# Patient Record
Sex: Female | Born: 1975 | Race: White | Hispanic: No | Marital: Married | Smoking: Never smoker
Health system: Southern US, Community
[De-identification: ages and names within clinical notes are randomized; demographics above are authoritative.]

## PROBLEM LIST (undated history)

## (undated) DIAGNOSIS — F419 Anxiety disorder, unspecified: Secondary | ICD-10-CM

## (undated) HISTORY — PX: WISDOM TOOTH EXTRACTION: SHX21

## (undated) HISTORY — DX: Anxiety disorder, unspecified: F41.9

---

## 2005-10-01 ENCOUNTER — Ambulatory Visit: Payer: Self-pay

## 2011-09-30 ENCOUNTER — Ambulatory Visit: Payer: Self-pay

## 2012-08-25 ENCOUNTER — Ambulatory Visit: Payer: Self-pay | Admitting: Unknown Physician Specialty

## 2013-12-23 LAB — HM PAP SMEAR: HM Pap smear: NORMAL

## 2015-01-10 ENCOUNTER — Other Ambulatory Visit: Payer: Self-pay | Admitting: Obstetrics and Gynecology

## 2015-01-10 DIAGNOSIS — Z1231 Encounter for screening mammogram for malignant neoplasm of breast: Secondary | ICD-10-CM

## 2015-01-16 ENCOUNTER — Ambulatory Visit
Admission: RE | Admit: 2015-01-16 | Discharge: 2015-01-16 | Disposition: A | Discharge status: Home / Self Care | Payer: 59 | Source: Ambulatory Visit | Attending: Obstetrics and Gynecology | Admitting: Obstetrics and Gynecology

## 2015-01-16 DIAGNOSIS — Z1231 Encounter for screening mammogram for malignant neoplasm of breast: Secondary | ICD-10-CM | POA: Diagnosis present

## 2015-01-17 ENCOUNTER — Other Ambulatory Visit: Payer: Self-pay | Admitting: Obstetrics and Gynecology

## 2015-01-17 DIAGNOSIS — R928 Other abnormal and inconclusive findings on diagnostic imaging of breast: Secondary | ICD-10-CM

## 2015-01-18 ENCOUNTER — Other Ambulatory Visit: Payer: Self-pay | Admitting: Obstetrics and Gynecology

## 2015-01-18 ENCOUNTER — Ambulatory Visit
Admission: RE | Admit: 2015-01-18 | Discharge: 2015-01-18 | Disposition: A | Discharge status: Home / Self Care | Payer: 59 | Source: Ambulatory Visit | Attending: Obstetrics and Gynecology | Admitting: Obstetrics and Gynecology

## 2015-01-18 DIAGNOSIS — R928 Other abnormal and inconclusive findings on diagnostic imaging of breast: Secondary | ICD-10-CM | POA: Diagnosis present

## 2015-12-13 ENCOUNTER — Other Ambulatory Visit: Payer: Self-pay | Admitting: Internal Medicine

## 2015-12-13 DIAGNOSIS — Z1231 Encounter for screening mammogram for malignant neoplasm of breast: Secondary | ICD-10-CM

## 2016-01-28 ENCOUNTER — Ambulatory Visit: Payer: 59 | Attending: Internal Medicine

## 2016-03-18 ENCOUNTER — Ambulatory Visit
Admission: RE | Admit: 2016-03-18 | Discharge: 2016-03-18 | Disposition: A | Discharge status: Home / Self Care | Payer: 59 | Source: Ambulatory Visit | Attending: Internal Medicine | Admitting: Internal Medicine

## 2016-03-18 DIAGNOSIS — Z1231 Encounter for screening mammogram for malignant neoplasm of breast: Secondary | ICD-10-CM | POA: Diagnosis not present

## 2016-07-29 ENCOUNTER — Ambulatory Visit: Payer: Self-pay | Admitting: Obstetrics and Gynecology

## 2016-08-05 DIAGNOSIS — J069 Acute upper respiratory infection, unspecified: Secondary | ICD-10-CM | POA: Diagnosis not present

## 2016-11-13 ENCOUNTER — Other Ambulatory Visit: Payer: Self-pay | Admitting: Internal Medicine

## 2016-11-13 DIAGNOSIS — R221 Localized swelling, mass and lump, neck: Secondary | ICD-10-CM | POA: Diagnosis not present

## 2016-11-20 ENCOUNTER — Ambulatory Visit
Admission: RE | Admit: 2016-11-20 | Discharge: 2016-11-20 | Disposition: A | Discharge status: Home / Self Care | Payer: 59 | Source: Ambulatory Visit | Attending: Internal Medicine | Admitting: Internal Medicine

## 2016-11-20 ENCOUNTER — Encounter: Payer: Self-pay | Admitting: Radiology

## 2016-11-20 DIAGNOSIS — R221 Localized swelling, mass and lump, neck: Secondary | ICD-10-CM | POA: Diagnosis not present

## 2016-11-20 MED ORDER — IOPAMIDOL (ISOVUE-300) INJECTION 61%
75.0000 mL | Freq: Once | INTRAVENOUS | Status: AC | PRN
  Administered 2016-11-20: 75 mL via INTRAVENOUS

## 2017-03-19 DIAGNOSIS — Z23 Encounter for immunization: Secondary | ICD-10-CM | POA: Diagnosis not present

## 2017-03-19 DIAGNOSIS — Z0000 Encounter for general adult medical examination without abnormal findings: Secondary | ICD-10-CM | POA: Diagnosis not present

## 2017-03-20 DIAGNOSIS — Z0000 Encounter for general adult medical examination without abnormal findings: Secondary | ICD-10-CM | POA: Diagnosis not present

## 2017-04-01 ENCOUNTER — Other Ambulatory Visit: Payer: Self-pay | Admitting: Obstetrics and Gynecology

## 2017-04-01 DIAGNOSIS — Z1231 Encounter for screening mammogram for malignant neoplasm of breast: Secondary | ICD-10-CM

## 2017-04-27 ENCOUNTER — Ambulatory Visit: Payer: Self-pay | Admitting: Obstetrics and Gynecology

## 2017-04-30 ENCOUNTER — Ambulatory Visit
Admission: RE | Admit: 2017-04-30 | Discharge: 2017-04-30 | Disposition: A | Discharge status: Home / Self Care | Payer: 59 | Source: Ambulatory Visit | Attending: Obstetrics and Gynecology | Admitting: Obstetrics and Gynecology

## 2017-04-30 DIAGNOSIS — Z1231 Encounter for screening mammogram for malignant neoplasm of breast: Secondary | ICD-10-CM | POA: Diagnosis not present

## 2017-05-20 ENCOUNTER — Ambulatory Visit: Payer: Self-pay | Admitting: Obstetrics and Gynecology

## 2017-05-20 ENCOUNTER — Encounter: Payer: Self-pay | Admitting: Obstetrics and Gynecology

## 2018-02-16 ENCOUNTER — Ambulatory Visit (INDEPENDENT_AMBULATORY_CARE_PROVIDER_SITE_OTHER): Payer: Managed Care, Other (non HMO) | Admitting: Obstetrics and Gynecology

## 2018-02-16 ENCOUNTER — Encounter: Payer: Self-pay | Admitting: Obstetrics and Gynecology

## 2018-02-16 VITALS — BP 118/74 | Ht 62.0 in | Wt 144.0 lb

## 2018-02-16 DIAGNOSIS — Z01419 Encounter for gynecological examination (general) (routine) without abnormal findings: Secondary | ICD-10-CM

## 2018-02-16 DIAGNOSIS — Z1331 Encounter for screening for depression: Secondary | ICD-10-CM

## 2018-02-16 DIAGNOSIS — Z124 Encounter for screening for malignant neoplasm of cervix: Secondary | ICD-10-CM

## 2018-02-16 DIAGNOSIS — Z1339 Encounter for screening examination for other mental health and behavioral disorders: Secondary | ICD-10-CM

## 2018-02-16 LAB — HM PAP SMEAR: HM Pap smear: NORMAL

## 2018-02-16 NOTE — Progress Notes (Signed)
Gynecology Annual Exam  PCP: Rusty Aus, MD  Chief Complaint  Patient presents with  . Annual Exam    History of Present Illness:  Ms. April Edwards is a 42 y.o. G1P1001 who LMP was Patient's last menstrual period was 01/17/2018., presents today for her annual examination.  Her menses are regular every 28-30 days, lasting 5 day(s).  Dysmenorrhea none. She does not have intermenstrual bleeding.  She does have vasomotor symptoms very infrequently. She is unsure whether this relates to her recently being on a Keto diet, which she stopped about 1 month ago.  She does have a little vaginal dryness. No discomfort with intercourse.   She is single partner, contraception - coitus interruptus. She does have vaginal dryness.  Last Pap: 2015  Results were: no abnormalities /neg HPV DNA.  Hx of STDs: none  Last mammogram: 04/2017  Results were: normal--routine follow-up in 12 months There is no FH of breast cancer. There is no FH of ovarian cancer. The patient does do self-breast exams.  Colonoscopy: n/a DEXA: has not been screened for osteoporosis  Tobacco use: The patient denies current or previous tobacco use. Alcohol use: social drinker Exercise: very active  The patient wears seatbelts: yes.     Past Medical History:  Diagnosis Date  . Anxiety     Past Surgical History:  Procedure Laterality Date  . WISDOM TOOTH EXTRACTION      Prior to Admission medications   Medication Sig Start Date End Date Taking? Authorizing Provider  mirtazapine (REMERON) 15 MG tablet TAKE 1 TABLET BY MOUTH AT BEDTIME 09/16/17  Yes [provider]  Multiple Vitamin (MULTI-VITAMINS) TABS Take by mouth.    [provider]    Allergies  Allergen Reactions  . Benadryl [Diphenhydramine] Other (See Comments)    Pt states she becomes very hyperactive.    Obstetric History: G1P1001  Family History  Problem Relation Age of Onset  . Diabetes Mellitus II Paternal Grandmother   .  Brain cancer Paternal Grandfather   . Lung cancer Mother   . Kidney failure Father   . Breast cancer Neg Hx     Social History   Socioeconomic History  . Marital status: Married    Spouse name: Not on file  . Number of children: Not on file  . Years of education: Not on file  . Highest education level: Not on file  Occupational History  . Not on file  Social Needs  . Financial resource strain: Not on file  . Food insecurity:    Worry: Not on file    Inability: Not on file  . Transportation needs:    Medical: Not on file    Non-medical: Not on file  Tobacco Use  . Smoking status: Never Smoker  . Smokeless tobacco: Never Used  Substance and Sexual Activity  . Alcohol use: Not Currently  . Drug use: Never  . Sexual activity: Yes    Birth control/protection: None  Lifestyle  . Physical activity:    Days per week: Not on file    Minutes per session: Not on file  . Stress: Not on file  Relationships  . Social connections:    Talks on phone: Not on file    Gets together: Not on file    Attends religious service: Not on file    Active member of club or organization: Not on file    Attends meetings of clubs or organizations: Not on file    Relationship status:  Not on file  . Intimate partner violence:    Fear of current or ex partner: Not on file    Emotionally abused: Not on file    Physically abused: Not on file    Forced sexual activity: Not on file  Other Topics Concern  . Not on file  Social History Narrative  . Not on file   Review of Systems  Constitutional: Negative.   HENT: Negative.   Eyes: Negative.   Respiratory: Negative.   Cardiovascular: Negative.   Gastrointestinal: Negative.   Genitourinary: Negative.   Musculoskeletal: Negative.   Skin: Negative.   Neurological: Negative.   Psychiatric/Behavioral: Negative.      Physical Exam BP 118/74   Ht 5' 2"  (1.575 m)   Wt 144 lb (65.3 kg)   LMP 01/17/2018   BMI 26.34 kg/m   Physical Exam   Constitutional: She is oriented to person, place, and time. She appears well-developed and well-nourished. No distress.  Genitourinary: Uterus normal. Pelvic exam was performed with patient supine. There is no rash, tenderness, lesion or injury on the right labia. There is no rash, tenderness, lesion or injury on the left labia. No erythema, tenderness or bleeding in the vagina. No signs of injury around the vagina. No vaginal discharge found. Right adnexum does not display mass, does not display tenderness and does not display fullness. Left adnexum does not display mass, does not display tenderness and does not display fullness. Cervix does not exhibit motion tenderness, lesion, discharge or polyp.   Uterus is mobile and anteverted. Uterus is not enlarged, tender or exhibiting a mass.  HENT:  Head: Normocephalic and atraumatic.  Eyes: EOM are normal. No scleral icterus.  Neck: Normal range of motion. Neck supple. No thyromegaly present.  Cardiovascular: Normal rate and regular rhythm. Exam reveals no gallop and no friction rub.  No murmur heard. Pulmonary/Chest: Effort normal and breath sounds normal. No respiratory distress. She has no wheezes. She has no rales. Right breast exhibits no inverted nipple, no mass, no nipple discharge, no skin change and no tenderness. Left breast exhibits no inverted nipple, no mass, no nipple discharge, no skin change and no tenderness.  Abdominal: Soft. Bowel sounds are normal. She exhibits no distension and no mass. There is no tenderness. There is no rebound and no guarding.  Musculoskeletal: Normal range of motion. She exhibits no edema or tenderness.  Lymphadenopathy:    She has no cervical adenopathy.       Right: No inguinal adenopathy present.       Left: No inguinal adenopathy present.  Neurological: She is alert and oriented to person, place, and time. No cranial nerve deficit.  Skin: Skin is warm and dry. No rash noted. No erythema.  Psychiatric:  She has a normal mood and affect. Her behavior is normal. Judgment normal.   Female chaperone present for pelvic and breast  portions of the physical exam  Results: AUDIT Questionnaire (screen for alcoholism): 2 PHQ-9: 2  Assessment: 42 y.o. G18P1001 female here for routine gynecologic examination.  Plan: Problem List Items Addressed This Visit    None    Visit Diagnoses    Women's annual routine gynecological examination    -  Primary   Relevant Orders   IGP, Aptima HPV, rfx 16/18,45   Screening for depression       Screening for alcoholism       Pap smear for cervical cancer screening       Relevant Orders   IGP,  Aptima HPV, rfx 16/18,45     Screening: -- Blood pressure screen normal -- Colonoscopy - not due -- Mammogram - due. Patient to call [facility] to arrange. She understands that it is her responsibility to arrange this. -- Weight screening: normal -- Depression screening negative (PHQ-9) -- Nutrition: normal -- cholesterol screening: per PCP -- osteoporosis screening: not due -- tobacco screening: not using -- alcohol screening: AUDIT questionnaire indicates low-risk usage. -- family history of breast cancer screening: done. not at high risk. -- no evidence of domestic violence or intimate partner violence. -- STD screening: gonorrhea/chlamydia NAAT collected -- pap smear collected per ASCCP guidelines -- flu vaccine will get through work -- HPV vaccination series: not eligilbe  Prentice Docker, MD 02/16/2018 9:20 AM

## 2018-02-19 LAB — IGP, APTIMA HPV, RFX 16/18,45
HPV Aptima: NEGATIVE
PAP Smear Comment: 0

## 2018-02-20 ENCOUNTER — Encounter: Payer: Self-pay | Admitting: Obstetrics and Gynecology

## 2018-03-30 ENCOUNTER — Other Ambulatory Visit: Payer: Self-pay | Admitting: Internal Medicine

## 2018-03-30 DIAGNOSIS — Z1231 Encounter for screening mammogram for malignant neoplasm of breast: Secondary | ICD-10-CM

## 2018-05-03 ENCOUNTER — Ambulatory Visit
Admission: RE | Admit: 2018-05-03 | Discharge: 2018-05-03 | Disposition: A | Discharge status: Home / Self Care | Payer: Managed Care, Other (non HMO) | Source: Ambulatory Visit | Attending: Internal Medicine | Admitting: Internal Medicine

## 2018-05-03 DIAGNOSIS — Z1231 Encounter for screening mammogram for malignant neoplasm of breast: Secondary | ICD-10-CM | POA: Insufficient documentation

## 2018-05-05 ENCOUNTER — Other Ambulatory Visit: Payer: Self-pay | Admitting: Internal Medicine

## 2018-05-05 DIAGNOSIS — R928 Other abnormal and inconclusive findings on diagnostic imaging of breast: Secondary | ICD-10-CM

## 2018-05-18 ENCOUNTER — Ambulatory Visit
Admission: RE | Admit: 2018-05-18 | Discharge: 2018-05-18 | Disposition: A | Discharge status: Home / Self Care | Payer: Managed Care, Other (non HMO) | Source: Ambulatory Visit | Attending: Internal Medicine | Admitting: Internal Medicine

## 2018-05-18 DIAGNOSIS — R928 Other abnormal and inconclusive findings on diagnostic imaging of breast: Secondary | ICD-10-CM

## 2018-06-04 ENCOUNTER — Other Ambulatory Visit: Payer: Self-pay | Admitting: Internal Medicine

## 2018-06-04 DIAGNOSIS — R1011 Right upper quadrant pain: Secondary | ICD-10-CM

## 2018-06-04 DIAGNOSIS — K2100 Gastro-esophageal reflux disease with esophagitis, without bleeding: Secondary | ICD-10-CM

## 2018-06-08 ENCOUNTER — Encounter: Payer: Self-pay | Admitting: Emergency Medicine

## 2018-06-08 ENCOUNTER — Emergency Department
Admission: EM | Admit: 2018-06-08 | Discharge: 2018-06-08 | Disposition: A | Discharge status: Home / Self Care | Payer: Managed Care, Other (non HMO) | Attending: Student in an Organized Health Care Education/Training Program | Admitting: Student in an Organized Health Care Education/Training Program

## 2018-06-08 ENCOUNTER — Emergency Department: Payer: Managed Care, Other (non HMO)

## 2018-06-08 ENCOUNTER — Other Ambulatory Visit: Payer: Self-pay

## 2018-06-08 DIAGNOSIS — R51 Headache: Secondary | ICD-10-CM | POA: Insufficient documentation

## 2018-06-08 DIAGNOSIS — R0789 Other chest pain: Secondary | ICD-10-CM | POA: Diagnosis not present

## 2018-06-08 DIAGNOSIS — Z79899 Other long term (current) drug therapy: Secondary | ICD-10-CM | POA: Insufficient documentation

## 2018-06-08 DIAGNOSIS — R42 Dizziness and giddiness: Secondary | ICD-10-CM | POA: Diagnosis not present

## 2018-06-08 LAB — TROPONIN I
Troponin I: <0.03 ng/mL (ref <0.03)
Troponin I: <0.03 ng/mL (ref <0.03)

## 2018-06-08 LAB — BASIC METABOLIC PANEL
ANION GAP: 10 (ref 5–15)
BUN: 11 mg/dL (ref 6–20)
CALCIUM: 9 mg/dL (ref 8.9–10.3)
CHLORIDE: 107 mmol/L (ref 98–111)
CO2: 21 mmol/L — AB (ref 22–32)
Creatinine, Ser: 0.78 mg/dL (ref 0.44–1.00)
GFR calc non Af Amer: >60 mL/min (ref >60)
Glucose, Bld: 121 mg/dL — ABNORMAL HIGH (ref 70–99)
Potassium: 2.9 mmol/L — ABNORMAL LOW (ref 3.5–5.1)
SODIUM: 138 mmol/L (ref 135–145)

## 2018-06-08 LAB — CBC
HEMATOCRIT: 38.3 % (ref 36.0–46.0)
Hemoglobin: 13.1 g/dL (ref 12.0–15.0)
MCH: 30.1 pg (ref 26.0–34.0)
MCHC: 34.2 g/dL (ref 30.0–36.0)
MCV: 88 fL (ref 80.0–100.0)
NRBC: 0 % (ref 0.0–0.2)
PLATELETS: 267 10*3/uL (ref 150–400)
RBC: 4.35 MIL/uL (ref 3.87–5.11)
RDW: 12 % (ref 11.5–15.5)
WBC: 5.4 10*3/uL (ref 4.0–10.5)

## 2018-06-08 LAB — POCT PREGNANCY, URINE: Preg Test, Ur: NEGATIVE

## 2018-06-08 LAB — FIBRIN DERIVATIVES D-DIMER (ARMC ONLY): FIBRIN DERIVATIVES D-DIMER (ARMC): 321.81 ng{FEU}/mL (ref 0.00–499.00)

## 2018-06-08 NOTE — ED Provider Notes (Signed)
[facility] Emergency Department Provider Note    First MD Initiated Contact with Patient 06/08/18 1236     (approximate)  I have reviewed the triage vital signs and the nursing notes.   HISTORY  Chief Complaint Chest Pain; Headache; and Dizziness    HPI April Edwards is a 43 y.o. female with history of anxiety presents the ER with several days of lightheadedness dizziness forehead pressure that is intermittent as well as chest tightness that started this morning.  Denies any pleuritic pain.  No lower extremity swelling.  No recent surgeries not any blood thinners.  Denies any history of smoking.  No history of heart disease.  Denies any pain at this time.  States she is stressed out as she does lost her father 2 weeks ago.  Denies any blurry vision.  No numbness or tingling.    Past Medical History:  Diagnosis Date  . Anxiety    Family History  Problem Relation Age of Onset  . Diabetes Mellitus II Paternal Grandmother   . Brain cancer Paternal Grandfather   . Lung cancer Mother   . Kidney failure Father   . Breast cancer Neg Hx    Past Surgical History:  Procedure Laterality Date  . WISDOM TOOTH EXTRACTION     There are no active problems to display for this patient.     Prior to Admission medications   Medication Sig Start Date End Date Taking? Authorizing Provider  mirtazapine (REMERON) 15 MG tablet TAKE 1 TABLET BY MOUTH AT BEDTIME 09/16/17   [provider]  Multiple Vitamin (MULTI-VITAMINS) TABS Take by mouth.    [provider]    Allergies Benadryl [diphenhydramine]    Social History Social History   Tobacco Use  . Smoking status: Never Smoker  . Smokeless tobacco: Never Used  Substance Use Topics  . Alcohol use: Not Currently  . Drug use: Never    Review of Systems Patient denies headaches, rhinorrhea, blurry vision, numbness, shortness of breath, chest pain, edema, cough, abdominal pain, nausea,  vomiting, diarrhea, dysuria, fevers, rashes or hallucinations unless otherwise stated above in HPI. ____________________________________________   PHYSICAL EXAM:  VITAL SIGNS: Vitals:   06/08/18 1445 06/08/18 1447  BP:  118/86  Pulse: 71 67  Resp: 13 11  Temp:    SpO2: 100% 100%    Constitutional: Alert and oriented.  Eyes: Conjunctivae are normal.  Head: Atraumatic. Nose: No congestion/rhinnorhea. Mouth/Throat: Mucous membranes are moist.   Neck: No stridor. Painless ROM.  Cardiovascular: Normal rate, regular rhythm. Grossly normal heart sounds.  Good peripheral circulation. Respiratory: Normal respiratory effort.  No retractions. Lungs CTAB. Gastrointestinal: Soft and nontender. No distention. No abdominal bruits. No CVA tenderness. Genitourinary:  Musculoskeletal: No lower extremity tenderness nor edema.  No joint effusions. Neurologic:  CN- intact.  No facial droop, Normal FNF.  Normal heel to shin.  Sensation intact bilaterally. Normal speech and language. No gross focal neurologic deficits are appreciated. No gait instability. Skin:  Skin is warm, dry and intact. No rash noted. Psychiatric: Mood and affect are normal. Speech and behavior are normal.  ____________________________________________   LABS (all labs ordered are listed, but only abnormal results are displayed)  Results for orders placed or performed during the hospital encounter of 06/08/18 (from the past 24 hour(s))  Basic metabolic panel     Status: Abnormal   Collection Time: 06/08/18 10:14 AM  Result Value Ref Range   Sodium 138 135 - 145 mmol/L  Potassium 2.9 (L) 3.5 - 5.1 mmol/L   Chloride 107 98 - 111 mmol/L   CO2 21 (L) 22 - 32 mmol/L   Glucose, Bld 121 (H) 70 - 99 mg/dL   BUN 11 6 - 20 mg/dL   Creatinine, Ser 0.78 0.44 - 1.00 mg/dL   Calcium 9.0 8.9 - 10.3 mg/dL   GFR calc non Af Amer >60 >60 mL/min   GFR calc Af Amer >60 >60 mL/min   Anion gap 10 5 - 15  CBC     Status: None   Collection  Time: 06/08/18 10:14 AM  Result Value Ref Range   WBC 5.4 4.0 - 10.5 K/uL   RBC 4.35 3.87 - 5.11 MIL/uL   Hemoglobin 13.1 12.0 - 15.0 g/dL   HCT 38.3 36.0 - 46.0 %   MCV 88.0 80.0 - 100.0 fL   MCH 30.1 26.0 - 34.0 pg   MCHC 34.2 30.0 - 36.0 g/dL   RDW 12.0 11.5 - 15.5 %   Platelets 267 150 - 400 K/uL   nRBC 0.0 0.0 - 0.2 %  Troponin I - ONCE - STAT     Status: None   Collection Time: 06/08/18 10:14 AM  Result Value Ref Range   Troponin I <0.03 <0.03 ng/mL  Pregnancy, urine POC     Status: None   Collection Time: 06/08/18 12:44 PM  Result Value Ref Range   Preg Test, Ur NEGATIVE NEGATIVE  Troponin I - ONCE - STAT     Status: None   Collection Time: 06/08/18  1:27 PM  Result Value Ref Range   Troponin I <0.03 <0.03 ng/mL  Fibrin derivatives D-Dimer ([facility] only)     Status: None   Collection Time: 06/08/18  1:27 PM  Result Value Ref Range   Fibrin derivatives D-dimer ([facility]) 321.81 0.00 - 499.00 ng/mL (FEU)   ____________________________________________  EKG My review and personal interpretation at Time: 10:11   Indication: chest tightness  Rate: 110  Rhythm: sinus Axis: normal Other: normal intervals, no stemi ____________________________________________  RADIOLOGY  I personally reviewed all radiographic images ordered to evaluate for the above acute complaints and reviewed radiology reports and findings.  These findings were personally discussed with the patient.  Please see medical record for radiology report.  ____________________________________________   PROCEDURES  Procedure(s) performed:  Procedures    Critical Care performed: no ____________________________________________   INITIAL IMPRESSION / ASSESSMENT AND PLAN / ED COURSE  Pertinent labs & imaging results that were available during my care of the patient were reviewed by me and considered in my medical decision making (see chart for details).   DDX: Dysrhythmia, stress, ACS, dissection, PE,  sinusitis, mass  April Edwards is a 43 y.o. who presents to the ED with symptoms as described above.  Patient well-appearing.  Neuro exam is intact and nonfocal.  Doubt CNS pathology.  EKG shows no evidence of acute ischemia.  Initial troponin is negative.  Patient low risk heart score.  She is low risk by Wells criteria therefore d-dimer will be sent to further evaluate.  Will place on cardiac monitor and repeat troponin to further re-stratify.  Do have a high suspicion for stress-induced symptomatology given the recent loss of her family member however medical evaluation will be indicated.  The patient will be placed on continuous pulse oximetry and telemetry for monitoring.  Laboratory evaluation will be sent to evaluate for the above complaints.     Clinical Course as of Jun 08 1529  Tue Jun 08, 2018  1412 Blood work is reassuring.  Patient remains hemodynamically stable.  No signs of infectious process.  Dimer is negative.  Repeat troponin negative.  At this point patient stable and appropriate for outpatient follow-up.   [PR]    Clinical Course User Index [PR] Merlyn Lot, MD     As part of my medical decision making, I reviewed the following data within the [facility] notes reviewed and incorporated, Labs reviewed, notes from prior ED visits.   ____________________________________________   FINAL CLINICAL IMPRESSION(S) / ED DIAGNOSES  Final diagnoses:  Atypical chest pain      NEW MEDICATIONS STARTED DURING THIS VISIT:  Discharge Medication List as of 06/08/2018  2:13 PM       Note:  This document was prepared using Dragon voice recognition software and may include unintentional dictation errors.    Merlyn Lot, MD 06/08/18 1531

## 2018-06-08 NOTE — ED Notes (Signed)
Gave a urine cup with bag to pt.

## 2018-06-09 ENCOUNTER — Ambulatory Visit
Admission: RE | Admit: 2018-06-09 | Discharge: 2018-06-09 | Disposition: A | Discharge status: Home / Self Care | Payer: Managed Care, Other (non HMO) | Source: Ambulatory Visit | Attending: Internal Medicine | Admitting: Internal Medicine

## 2018-06-09 DIAGNOSIS — R1011 Right upper quadrant pain: Secondary | ICD-10-CM | POA: Diagnosis not present

## 2018-06-09 DIAGNOSIS — K2100 Gastro-esophageal reflux disease with esophagitis, without bleeding: Secondary | ICD-10-CM

## 2019-03-25 ENCOUNTER — Other Ambulatory Visit: Payer: Self-pay | Admitting: Internal Medicine

## 2019-03-25 DIAGNOSIS — G450 Vertebro-basilar artery syndrome: Secondary | ICD-10-CM

## 2019-04-05 ENCOUNTER — Ambulatory Visit: Payer: Managed Care, Other (non HMO)

## 2019-04-25 DIAGNOSIS — D239 Other benign neoplasm of skin, unspecified: Secondary | ICD-10-CM

## 2019-04-25 HISTORY — DX: Other benign neoplasm of skin, unspecified: D23.9

## 2019-07-29 ENCOUNTER — Encounter: Payer: Self-pay | Admitting: Obstetrics and Gynecology

## 2019-07-29 ENCOUNTER — Other Ambulatory Visit: Payer: Self-pay

## 2019-07-29 ENCOUNTER — Ambulatory Visit (INDEPENDENT_AMBULATORY_CARE_PROVIDER_SITE_OTHER): Payer: 59 | Admitting: Obstetrics and Gynecology

## 2019-07-29 VITALS — BP 122/74 | Ht 63.0 in | Wt 154.0 lb

## 2019-07-29 DIAGNOSIS — Z01419 Encounter for gynecological examination (general) (routine) without abnormal findings: Secondary | ICD-10-CM

## 2019-07-29 DIAGNOSIS — Z1331 Encounter for screening for depression: Secondary | ICD-10-CM

## 2019-07-29 DIAGNOSIS — Z1339 Encounter for screening examination for other mental health and behavioral disorders: Secondary | ICD-10-CM

## 2019-07-29 NOTE — Progress Notes (Signed)
Gynecology Annual Exam  PCP: Rusty Aus, MD  Chief Complaint  Patient presents with  . Annual Exam   History of Present Illness:  Ms. April Edwards is a 44 y.o. G1P1001 who LMP was Patient's last menstrual period was 07/01/2019., presents today for her annual examination.  Her menses are regular, monthly, heavy the first two days, then lightens, lasts about 6 days total. No pain with her periods.  No intermenstrual bleeding.  She is sexually active and times intercourse and uses condoms if close to ovulation.  She does have some vaginal dryness. She denies dyspareunia.   Last Pap: 02/16/2018  Results were: no abnormalities /neg HPV DNA negative Hx of STDs: none  Last mammogram: 05/18/2018  Results were: Birads 1 There is no FH of breast cancer. There is no FH of ovarian cancer. The patient does not do self-breast exams.  Colonoscopy: never had DEXA: has not been screened for osteoporosis  Tobacco use: The patient denies current or previous tobacco use. Alcohol use: social drinker Exercise: she exercises most days.  The patient wears seatbelts: yes.     Past Medical History:  Diagnosis Date  . Anxiety     Past Surgical History:  Procedure Laterality Date  . WISDOM TOOTH EXTRACTION      Prior to Admission medications   Medication Sig Start Date End Date Taking? Authorizing Provider  mirtazapine (REMERON) 15 MG tablet TAKE 1 TABLET BY MOUTH AT BEDTIME 09/16/17  Yes [provider]  Multiple Vitamin (MULTI-VITAMINS) TABS Take by mouth.   Yes [provider]  Xanax, as needed  Allergies  Allergen Reactions  . Benadryl [Diphenhydramine] Other (See Comments)    Pt states she becomes very hyperactive.     Obstetric History: G1P1001  Family History  Problem Relation Age of Onset  . Diabetes Mellitus II Paternal Grandmother   . Brain cancer Paternal Grandfather   . Lung cancer Mother   . Kidney failure Father   . Breast cancer Neg Hx      Social History   Socioeconomic History  . Marital status: Married    Spouse name: Not on file  . Number of children: Not on file  . Years of education: Not on file  . Highest education level: Not on file  Occupational History  . Not on file  Tobacco Use  . Smoking status: Never Smoker  . Smokeless tobacco: Never Used  Substance and Sexual Activity  . Alcohol use: Not Currently  . Drug use: Never  . Sexual activity: Yes    Birth control/protection: None  Other Topics Concern  . Not on file  Social History Narrative  . Not on file   Social Determinants of Health   Financial Resource Strain:   . Difficulty of Paying Living Expenses:   Food Insecurity:   . Worried About Charity fundraiser in the Last Year:   . Arboriculturist in the Last Year:   Transportation Needs:   . Film/video editor (Medical):   Marland Kitchen Lack of Transportation (Non-Medical):   Physical Activity:   . Days of Exercise per Week:   . Minutes of Exercise per Session:   Stress:   . Feeling of Stress :   Social Connections:   . Frequency of Communication with Friends and Family:   . Frequency of Social Gatherings with Friends and Family:   . Attends Religious Services:   . Active Member of Clubs or Organizations:   . Attends Club  or Organization Meetings:   Marland Kitchen Marital Status:   Intimate Partner Violence:   . Fear of Current or Ex-Partner:   . Emotionally Abused:   Marland Kitchen Physically Abused:   . Sexually Abused:     Review of Systems  Constitutional: Negative.   HENT: Negative.   Eyes: Negative.   Respiratory: Negative.   Cardiovascular: Negative.   Gastrointestinal: Negative.   Genitourinary: Negative.   Musculoskeletal: Negative.   Skin: Negative.   Neurological: Negative.   Psychiatric/Behavioral: Negative.       Physical Exam BP 122/74   Ht 5' 3"  (1.6 m)   Wt 154 lb (69.9 kg)   LMP 07/01/2019   BMI 27.28 kg/m   Physical Exam Constitutional:      General: She is not in acute distress.     Appearance: Normal appearance. She is well-developed.  Genitourinary:     Pelvic exam was performed with patient supine.     Vulva, urethra, bladder and uterus normal.     No inguinal adenopathy present in the right or left side.    No signs of injury in the vagina.     No vaginal discharge, erythema, tenderness or bleeding.     No cervical motion tenderness, discharge, lesion or polyp.     Uterus is mobile.     Uterus is not enlarged or tender.     No uterine mass detected.    Uterus is anteverted.     No right or left adnexal mass present.     Right adnexa not tender or full.     Left adnexa not tender or full.  HENT:     Head: Normocephalic and atraumatic.  Eyes:     General: No scleral icterus.    Conjunctiva/sclera: Conjunctivae normal.  Neck:     Thyroid: No thyromegaly.  Cardiovascular:     Rate and Rhythm: Normal rate and regular rhythm.     Heart sounds: No murmur. No friction rub. No gallop.   Pulmonary:     Effort: Pulmonary effort is normal. No respiratory distress.     Breath sounds: Normal breath sounds. No wheezing or rales.  Chest:     Breasts:        Right: No inverted nipple, mass, nipple discharge, skin change or tenderness.        Left: No inverted nipple, mass, nipple discharge, skin change or tenderness.  Abdominal:     General: Bowel sounds are normal. There is no distension.     Palpations: Abdomen is soft. There is no mass.     Tenderness: There is no abdominal tenderness. There is no guarding or rebound.  Musculoskeletal:        General: No swelling or tenderness. Normal range of motion.     Cervical back: Normal range of motion and neck supple.  Lymphadenopathy:     Cervical: No cervical adenopathy.     Lower Body: No right inguinal adenopathy. No left inguinal adenopathy.  Neurological:     General: No focal deficit present.     Mental Status: She is alert and oriented to person, place, and time.     Cranial Nerves: No cranial nerve  deficit.  Skin:    General: Skin is warm and dry.     Findings: No erythema or rash.  Psychiatric:        Mood and Affect: Mood normal.        Behavior: Behavior normal.        Judgment: Judgment  normal.     Female chaperone present for pelvic and breast  portions of the physical exam  Results: AUDIT Questionnaire (screen for alcoholism): 1 PHQ-9: 2  Assessment: 44 y.o. No obstetric history on file. here for routine gynecologic annual examination  Plan: Problem List Items Addressed This Visit    None    Visit Diagnoses    Women's annual routine gynecological examination    -  Primary   Screening for depression       Screening for alcoholism          Screening: -- Blood pressure screen normal -- Colonoscopy - not due -- Mammogram - due. Patient to call [facility] to arrange. She understands that it is her responsibility to arrange this. -- Weight screening: normal -- Depression screening negative (PHQ-9) -- Nutrition: normal -- cholesterol screening: per PCP -- osteoporosis screening: not due -- tobacco screening: not using -- alcohol screening: AUDIT questionnaire indicates low-risk usage. -- family history of breast cancer screening: done. not at high risk. -- no evidence of domestic violence or intimate partner violence. -- STD screening: gonorrhea/chlamydia NAAT not collected per patient request. -- pap smear not collected per ASCCP guidelines -- flu vaccine received this season -- HPV vaccination series: not eligilbe  Prentice Docker, MD 07/29/2019 10:28 AM

## 2020-01-02 ENCOUNTER — Other Ambulatory Visit: Payer: Self-pay | Admitting: Internal Medicine

## 2020-01-03 NOTE — Patient Instructions (Signed)
I value your feedback and entrusting [place] with your care. If you get a [facility] patient survey, I would appreciate you taking the time to let us know about your experience today. Thank you!  As of April 28, 2019, your lab results will be released to your MyChart immediately, before I even have a chance to see them. Please give me time to review them and contact you if there are any abnormalities. Thank you for your patience.

## 2020-01-03 NOTE — Progress Notes (Signed)
Rusty Aus, MD   Chief Complaint  Patient presents with   Breast Exam    Lump on LB that comes and goes, tender at times x 1 week   [employer] Employee    HPI:      Ms. April Edwards is a 44 y.o. No obstetric history on file. whose LMP was Patient's last menstrual period was 12/29/2019 (exact date)., presents today for eval of non-tender LT breast mass for the past wk that comes and goes. It is gone now, however. Pt noticed on SBE. No erythema, trauma, nipple d/c. Tenderness is from feeling area repeatedly. Was the size of the tip of her pinky. Pt tried to schedule screening mammo but they said she needed an appt first. Pt with hx of dense breasts/fibrocystic breasts.  No FH breast/ovar cancer. Doesn't drink caffeine. Last mammo 12/19 was neg after addl LT breast u/s. Screening mammo past due.  Breast exam, last annual 3/21.  Past Medical History:  Diagnosis Date   Anxiety     Past Surgical History:  Procedure Laterality Date   WISDOM TOOTH EXTRACTION      Family History  Problem Relation Age of Onset   Diabetes Mellitus II Paternal Grandmother    Brain cancer Paternal Grandfather    Lung cancer Mother    Kidney failure Father    Breast cancer Neg Hx     Social History   Socioeconomic History   Marital status: Married    Spouse name: Not on file   Number of children: Not on file   Years of education: Not on file   Highest education level: Not on file  Occupational History   Not on file  Tobacco Use   Smoking status: Never Smoker   Smokeless tobacco: Never Used  Vaping Use   Vaping Use: Never used  Substance and Sexual Activity   Alcohol use: Not Currently   Drug use: Never   Sexual activity: Yes    Birth control/protection: None  Other Topics Concern   Not on file  Social History Narrative   Not on file   Social Determinants of Health   Financial Resource Strain:    Difficulty of Paying Living Expenses:   Food Insecurity:     Worried About Charity fundraiser in the Last Year:    Arboriculturist in the Last Year:   Transportation Needs:    Film/video editor (Medical):    Lack of Transportation (Non-Medical):   Physical Activity:    Days of Exercise per Week:    Minutes of Exercise per Session:   Stress:    Feeling of Stress :   Social Connections:    Frequency of Communication with Friends and Family:    Frequency of Social Gatherings with Friends and Family:    Attends Religious Services:    Active Member of Clubs or Organizations:    Attends Music therapist:    Marital Status:   Intimate Partner Violence:    Fear of Current or Ex-Partner:    Emotionally Abused:    Physically Abused:    Sexually Abused:     Outpatient Medications Prior to Visit  Medication Sig Dispense Refill   calcium carbonate (TUMS EX) 750 MG chewable tablet Chew by mouth.     mirtazapine (REMERON) 15 MG tablet TAKE 1 TABLET BY MOUTH AT BEDTIME     Multiple Vitamin (MULTI-VITAMINS) TABS Take by mouth.     No facility-administered medications  prior to visit.      ROS:  Review of Systems  Constitutional: Negative for fever.  Gastrointestinal: Negative for blood in stool, constipation, diarrhea, nausea and vomiting.  Genitourinary: Negative for dyspareunia, dysuria, flank pain, frequency, hematuria, urgency, vaginal bleeding, vaginal discharge and vaginal pain.  Musculoskeletal: Negative for back pain.  Skin: Negative for rash.   BREAST: mass   OBJECTIVE:   Vitals:  BP 100/60    Ht 5' 2.5" (1.588 m)    Wt 137 lb (62.1 kg)    LMP 12/29/2019 (Exact Date)    BMI 24.66 kg/m   Physical Exam Vitals reviewed.  Pulmonary:     Effort: Pulmonary effort is normal.  Chest:     Breasts: Breasts are symmetrical.        Right: No inverted nipple, mass, nipple discharge, skin change or tenderness.        Left: No inverted nipple, mass, nipple discharge, skin change or tenderness.     Musculoskeletal:        General: Normal range of motion.     Cervical back: Normal range of motion.  Skin:    General: Skin is warm and dry.  Neurological:     General: No focal deficit present.     Mental Status: She is alert and oriented to person, place, and time.     Cranial Nerves: No cranial nerve deficit.  Psychiatric:        Mood and Affect: Mood normal.        Behavior: Behavior normal.        Thought Content: Thought content normal.        Judgment: Judgment normal.     Assessment/Plan: Normal breast exam--Neg breast exam bilat. Could have been fibrocystic changes. Can do scr mammo. Pt to f/u if mass returns and will change to dx mammo.   Encounter for screening mammogram for malignant neoplasm of breast - Plan: MM 3D SCREEN BREAST BILATERAL     Return if symptoms worsen or fail to improve.  Eliseo Withers B. Babbette Dalesandro, PA-C 01/04/2020 10:16 AM

## 2020-01-04 ENCOUNTER — Encounter: Payer: Self-pay | Admitting: Obstetrics and Gynecology

## 2020-01-04 ENCOUNTER — Other Ambulatory Visit: Payer: Self-pay

## 2020-01-04 ENCOUNTER — Ambulatory Visit (INDEPENDENT_AMBULATORY_CARE_PROVIDER_SITE_OTHER): Payer: 59 | Admitting: Obstetrics and Gynecology

## 2020-01-04 VITALS — BP 100/60 | Ht 62.5 in | Wt 137.0 lb

## 2020-01-04 DIAGNOSIS — Z1231 Encounter for screening mammogram for malignant neoplasm of breast: Secondary | ICD-10-CM | POA: Diagnosis not present

## 2020-01-04 DIAGNOSIS — Z0000 Encounter for general adult medical examination without abnormal findings: Secondary | ICD-10-CM | POA: Diagnosis not present

## 2020-01-26 ENCOUNTER — Ambulatory Visit
Admission: RE | Admit: 2020-01-26 | Discharge: 2020-01-26 | Disposition: A | Discharge status: Home / Self Care | Payer: 59 | Source: Ambulatory Visit | Attending: Obstetrics and Gynecology | Admitting: Obstetrics and Gynecology

## 2020-01-26 DIAGNOSIS — Z1231 Encounter for screening mammogram for malignant neoplasm of breast: Secondary | ICD-10-CM | POA: Diagnosis not present

## 2020-01-27 ENCOUNTER — Encounter: Payer: Self-pay | Admitting: Obstetrics and Gynecology

## 2020-05-02 ENCOUNTER — Encounter: Payer: Managed Care, Other (non HMO) | Admitting: Dermatology

## 2020-08-24 ENCOUNTER — Encounter: Payer: Self-pay | Admitting: Obstetrics and Gynecology

## 2020-08-24 ENCOUNTER — Other Ambulatory Visit: Payer: Self-pay

## 2020-08-24 ENCOUNTER — Ambulatory Visit (INDEPENDENT_AMBULATORY_CARE_PROVIDER_SITE_OTHER): Payer: 59 | Admitting: Obstetrics and Gynecology

## 2020-08-24 VITALS — BP 122/70 | Ht 62.0 in | Wt 138.0 lb

## 2020-08-24 DIAGNOSIS — Z01419 Encounter for gynecological examination (general) (routine) without abnormal findings: Secondary | ICD-10-CM

## 2020-08-24 DIAGNOSIS — Z1331 Encounter for screening for depression: Secondary | ICD-10-CM | POA: Diagnosis not present

## 2020-08-24 DIAGNOSIS — Z1339 Encounter for screening examination for other mental health and behavioral disorders: Secondary | ICD-10-CM

## 2020-08-24 DIAGNOSIS — Z1211 Encounter for screening for malignant neoplasm of colon: Secondary | ICD-10-CM

## 2020-08-24 NOTE — Progress Notes (Signed)
Gynecology Annual Exam  PCP: Rusty Aus, MD  Chief Complaint  Patient presents with  . Annual Exam   History of Present Illness:  Ms. April Edwards is a 45 y.o. G1P1001 who LMP was Patient's last menstrual period was 08/07/2020., presents today for her annual examination.  Her menses are regular every 28-30 days, lasting 5 day(s).  Dysmenorrhea none. She does not have intermenstrual bleeding.  She is sexually active.  She uses timing of intercourse and occasionally uses condoms around the time of ovulation. She does not have vaginal dryness.  Last Pap: 02/2018  Results were: no abnormalities /neg HPV DNA negative Hx of STDs: none  Last mammogram: 01/2020  Results were: normal--routine follow-up in 12 months There is no FH of breast cancer. There is no FH of ovarian cancer. The patient does do self-breast exams.  Colonoscopy: never had DEXA: has not been screened for osteoporosis  Tobacco use: The patient denies current or previous tobacco use. Alcohol use: social drinker Exercise: very active  The patient wears seatbelts: yes.     Past Medical History:  Diagnosis Date  . Anxiety   . Dysplastic nevus 04/25/2019   RUQ abdomen. Mild atypia    Past Surgical History:  Procedure Laterality Date  . WISDOM TOOTH EXTRACTION      Prior to Admission medications   Medication Sig Start Date End Date Taking? Authorizing Provider  calcium carbonate (TUMS EX) 750 MG chewable tablet Chew by mouth.    [provider]  mirtazapine (REMERON) 15 MG tablet TAKE 1 TABLET BY MOUTH AT BEDTIME 09/16/17   [provider]  Multiple Vitamin (MULTI-VITAMINS) TABS Take by mouth.    [provider]    Allergies  Allergen Reactions  . Diphenhydramine Hcl Other (See Comments)    Hyper-acting, anxiety.   . Benadryl [Diphenhydramine] Other (See Comments)    Pt states she becomes very hyperactive.    Obstetric History: G1P1001  Family History  Problem Relation Age of  Onset  . Diabetes Mellitus II Paternal Grandmother   . Brain cancer Paternal Grandfather   . Lung cancer Mother   . Kidney failure Father   . Breast cancer Neg Hx     Social History   Socioeconomic History  . Marital status: Married    Spouse name: Not on file  . Number of children: Not on file  . Years of education: Not on file  . Highest education level: Not on file  Occupational History  . Not on file  Tobacco Use  . Smoking status: Never Smoker  . Smokeless tobacco: Never Used  Vaping Use  . Vaping Use: Never used  Substance and Sexual Activity  . Alcohol use: Not Currently  . Drug use: Never  . Sexual activity: Yes    Birth control/protection: None  Other Topics Concern  . Not on file  Social History Narrative  . Not on file   Social Determinants of Health   Financial Resource Strain: Not on file  Food Insecurity: Not on file  Transportation Needs: Not on file  Physical Activity: Not on file  Stress: Not on file  Social Connections: Not on file  Intimate Partner Violence: Not on file    Review of Systems  Constitutional: Negative.   HENT: Negative.   Eyes: Negative.   Respiratory: Negative.   Cardiovascular: Negative.   Gastrointestinal: Negative.   Genitourinary: Negative.   Musculoskeletal: Negative.   Skin: Negative.   Neurological: Negative.   Psychiatric/Behavioral: Negative.  Physical Exam BP 122/70   Ht 5' 2"  (1.575 m)   Wt 138 lb (62.6 kg)   LMP 08/07/2020   BMI 25.24 kg/m   Physical Exam Constitutional:      General: She is not in acute distress.    Appearance: Normal appearance.  Genitourinary:     Bladder and urethral meatus normal.     No lesions in the vagina.     Right Labia: No rash, tenderness, lesions or skin changes.    Left Labia: No tenderness, lesions, skin changes or rash.    No inguinal adenopathy present in the right or left side.    Pelvic Tanner Score: 5/5.    No vaginal discharge, erythema, bleeding or  ulceration.     No vaginal prolapse present.     Right Adnexa: not tender, not full and no mass present.    Left Adnexa: not tender, not full and no mass present.    No cervical motion tenderness, discharge, friability, lesion or polyp.     Uterus is not enlarged, fixed or tender.     Uterus is anteverted.  Breasts:     Tanner Score is 5.     Right: No swelling, bleeding, inverted nipple, mass, nipple discharge, skin change, tenderness, axillary adenopathy or supraclavicular adenopathy.     Left: No swelling, bleeding, inverted nipple, mass, nipple discharge, skin change, tenderness, axillary adenopathy or supraclavicular adenopathy.    HENT:     Head: Normocephalic and atraumatic.  Eyes:     General: No scleral icterus.    Conjunctiva/sclera: Conjunctivae normal.  Lymphadenopathy:     Upper Body:     Right upper body: No supraclavicular or axillary adenopathy.     Left upper body: No supraclavicular or axillary adenopathy.     Lower Body: No right inguinal adenopathy. No left inguinal adenopathy.  Neurological:     General: No focal deficit present.     Mental Status: She is alert and oriented to person, place, and time.     Cranial Nerves: No cranial nerve deficit.  Psychiatric:        Mood and Affect: Mood normal.        Behavior: Behavior normal.        Judgment: Judgment normal.     Female chaperone present for pelvic and breast  portions of the physical exam  Results: AUDIT Questionnaire (screen for alcoholism): 1 PHQ-9: 1  Assessment: 45 y.o. G71P1001 female here for routine gynecologic examination.  Plan: Problem List Items Addressed This Visit   None   Visit Diagnoses    Women's annual routine gynecological examination    -  Primary   Relevant Orders   Cologuard   Screening for depression       Screening for alcoholism       Screen for colon cancer       Relevant Orders   Cologuard      Screening: -- Blood pressure screen normal -- Colonoscopy -  will order Cologuard -- Mammogram - not due -- Weight screening: normal -- Depression screening negative (PHQ-9) -- Nutrition: normal -- cholesterol screening: not due for screening -- osteoporosis screening: not due -- tobacco screening: not using -- alcohol screening: AUDIT questionnaire indicates low-risk usage. -- family history of breast cancer screening: done. not at high risk. -- no evidence of domestic violence or intimate partner violence. -- STD screening: gonorrhea/chlamydia NAAT not collected per patient request. -- pap smear not collected per ASCCP guidelines -- HPV vaccination  series: not Lanetta Inch, MD 08/24/2020 9:01 AM

## 2020-10-04 ENCOUNTER — Encounter: Payer: Managed Care, Other (non HMO) | Admitting: Dermatology

## 2021-01-17 ENCOUNTER — Ambulatory Visit: Payer: 59 | Admitting: Dermatology

## 2021-01-17 ENCOUNTER — Encounter: Payer: Self-pay | Admitting: Dermatology

## 2021-01-17 ENCOUNTER — Other Ambulatory Visit: Payer: Self-pay

## 2021-01-17 DIAGNOSIS — D225 Melanocytic nevi of trunk: Secondary | ICD-10-CM

## 2021-01-17 DIAGNOSIS — L578 Other skin changes due to chronic exposure to nonionizing radiation: Secondary | ICD-10-CM

## 2021-01-17 DIAGNOSIS — L821 Other seborrheic keratosis: Secondary | ICD-10-CM

## 2021-01-17 DIAGNOSIS — D229 Melanocytic nevi, unspecified: Secondary | ICD-10-CM

## 2021-01-17 DIAGNOSIS — Z1283 Encounter for screening for malignant neoplasm of skin: Secondary | ICD-10-CM | POA: Diagnosis not present

## 2021-01-17 DIAGNOSIS — L719 Rosacea, unspecified: Secondary | ICD-10-CM

## 2021-01-17 DIAGNOSIS — L918 Other hypertrophic disorders of the skin: Secondary | ICD-10-CM

## 2021-01-17 DIAGNOSIS — L814 Other melanin hyperpigmentation: Secondary | ICD-10-CM

## 2021-01-17 DIAGNOSIS — L820 Inflamed seborrheic keratosis: Secondary | ICD-10-CM

## 2021-01-17 DIAGNOSIS — Z86018 Personal history of other benign neoplasm: Secondary | ICD-10-CM

## 2021-01-17 DIAGNOSIS — D1800 Hemangioma unspecified site: Secondary | ICD-10-CM

## 2021-01-17 MED ORDER — RHOFADE 1 % EX CREA: TOPICAL_CREAM | CUTANEOUS | 6 refills | Status: DC

## 2021-01-17 MED ORDER — DOXYCYCLINE HYCLATE 20 MG PO TABS: 20.0000 mg | ORAL_TABLET | Freq: Two times a day (BID) | ORAL | 3 refills | Status: AC

## 2021-01-17 NOTE — Progress Notes (Signed)
Follow-Up Visit   Subjective  [place] is a 45 y.o. female who presents for the following: Annual Exam (Hx dysplastic nevus - patient has noticed a changing nevus on her R calf and L neck that she would like checked today.). The patient presents for Total-Body Skin Exam (TBSE) for skin cancer screening and mole check.  The following portions of the chart were reviewed this encounter and updated as appropriate:   Tobacco  Allergies  Meds  Problems  Med Hx  Surg Hx  Fam Hx     Review of Systems:  No other skin or systemic complaints except as noted in HPI or Assessment and Plan.  Objective  Well appearing patient in no apparent distress; mood and affect are within normal limits.  A full examination was performed including scalp, head, eyes, ears, nose, lips, neck, chest, axillae, abdomen, back, buttocks, bilateral upper extremities, bilateral lower extremities, hands, feet, fingers, toes, fingernails, and toenails. All findings within normal limits unless otherwise noted below.   Assessment & Plan  History of dysplastic nevus RUQA  Clear. Observe for recurrence. Call clinic for new or changing lesions.  Recommend regular skin exams, daily broad-spectrum spf 30+ sunscreen use, and photoprotection.     Rosacea Face  With some eye symptoms - ? Dry eye vs rosacea.   Chronic condition with duration or expected duration over one year. Condition is bothersome to patient. Currently flared.  Rosacea is a chronic progressive skin condition usually affecting the face of adults, causing redness and/or acne bumps. It is treatable but not curable. It sometimes affects the eyes (ocular rosacea) as well. It may respond to topical and/or systemic medication and can flare with stress, sun exposure, alcohol, exercise and some foods.  Daily application of broad spectrum spf 30+ sunscreen to face is recommended to reduce flares.  Start Doxycycline 5m po BID to help with cutaneous rosacea  and also see if she notes any change in ocular symptoms.   Doxycycline should be taken with food to prevent nausea. Do not lay down for 30 minutes after taking. Be cautious with sun exposure and use good sun protection while on this medication. Pregnant women should not take this medication.   If no change in eyes with doxycycline, would plan to switch to Metronidazole or Soolantra daily to bid for rosacea.  For erythema, start topical Rhofade qam  doxycycline (PERIOSTAT) 20 MG tablet - Face Take 1 tablet (20 mg total) by mouth 2 (two) times daily.  Oxymetazoline HCl (RHOFADE) 1 % CREA - Face Apply a thin coat to the entire face QAM.  Nevus (2) R buttock  Will request pathology records from [facility] history of moderate or greater atypia, would perform shave removal/biopsy.  Inflamed seborrheic keratosis L neck x 1  Destruction of lesion - L neck x 1 Complexity: simple   Destruction method: cryotherapy   Informed consent: discussed and consent obtained   Timeout:  patient name, date of birth, surgical site, and procedure verified Lesion destroyed using liquid nitrogen: Yes   Region frozen until ice ball extended beyond lesion: Yes   Outcome: patient tolerated procedure well with no complications   Post-procedure details: wound care instructions given    Lentigines - Scattered tan macules - Due to sun exposure - Benign-appering, observe - Recommend daily broad spectrum sunscreen SPF 30+ to sun-exposed areas, reapply every 2 hours as needed. - Call for any changes  Seborrheic Keratoses - Stuck-on, waxy, tan-brown papules and/or plaques  -  Benign-appearing - Discussed benign etiology and prognosis. - Observe - Call for any changes  Melanocytic Nevi - Tan-brown and/or pink-flesh-colored symmetric macules and papules - Benign appearing on exam today - Observation - Call clinic for new or changing moles - Recommend daily use of broad spectrum spf 30+ sunscreen to  sun-exposed areas.   Hemangiomas - Red papules - Discussed benign nature - Observe - Call for any changes  Actinic Damage - Chronic condition, secondary to cumulative UV/sun exposure - diffuse scaly erythematous macules with underlying dyspigmentation - Recommend daily broad spectrum sunscreen SPF 30+ to sun-exposed areas, reapply every 2 hours as needed.  - Staying in the shade or wearing long sleeves, sun glasses (UVA+UVB protection) and wide brim hats (4-inch brim around the entire circumference of the hat) are also recommended for sun protection.  - Call for new or changing lesions.  Acrochordons (Skin Tags) - Fleshy, skin-colored pedunculated papules - Benign appearing.  - Observe. - If desired, they can be removed with an in office procedure that is not covered by insurance. - Please call the clinic if you notice any new or changing lesions.  Skin cancer screening performed today.  Return for 1-2 mths re-pigmented nevi request .  I, Rudell Cobb, CMA, am acting as scribe for Forest Gleason, MD .  Documentation: I have reviewed the above documentation for accuracy and completeness, and I agree with the above.  Forest Gleason, MD

## 2021-01-17 NOTE — Patient Instructions (Signed)

## 2021-01-23 ENCOUNTER — Other Ambulatory Visit: Payer: Self-pay | Admitting: Obstetrics and Gynecology

## 2021-01-23 DIAGNOSIS — Z1231 Encounter for screening mammogram for malignant neoplasm of breast: Secondary | ICD-10-CM

## 2021-02-01 ENCOUNTER — Ambulatory Visit
Admission: RE | Admit: 2021-02-01 | Discharge: 2021-02-01 | Disposition: A | Discharge status: Home / Self Care | Payer: 59 | Source: Ambulatory Visit | Attending: Obstetrics and Gynecology | Admitting: Obstetrics and Gynecology

## 2021-02-01 ENCOUNTER — Other Ambulatory Visit: Payer: Self-pay

## 2021-02-01 DIAGNOSIS — Z1231 Encounter for screening mammogram for malignant neoplasm of breast: Secondary | ICD-10-CM | POA: Insufficient documentation

## 2021-02-06 ENCOUNTER — Other Ambulatory Visit: Payer: Self-pay | Admitting: Obstetrics and Gynecology

## 2021-02-06 DIAGNOSIS — N6489 Other specified disorders of breast: Secondary | ICD-10-CM

## 2021-02-06 DIAGNOSIS — R928 Other abnormal and inconclusive findings on diagnostic imaging of breast: Secondary | ICD-10-CM

## 2021-02-07 ENCOUNTER — Telehealth: Payer: Self-pay | Admitting: Dermatology

## 2021-02-07 NOTE — Telephone Encounter (Signed)
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 Since they did not see any repigmenation under the microscope, we do not need to repeat a biopsy at this area.  If she would like to recheck her rosacea at her appointment on 10/4, we can keep the appointment, or we can adjust over the phone recheck in a few more months. If she has noticed her eyes doing better while on the doxycycline, we can keep her on that. If she has not noticed any change in her eye symptoms, would recommend switch to a topical cream  MAs please call with update. Thank you!

## 2021-02-07 NOTE — Telephone Encounter (Signed)
Spoke with patient and advised her that bx done at right buttock was benign and we do not need to repeat biopsy. Patient advises that she has not noticed any change on doxycycline so she would like a topical cream sent in to [facility]. I cancelled her appt 10/4 and rescheduled her for 05/23/21. Patient was advised to call the office or send a msg via MyChart if she needs anything sooner.Cherly Hensen

## 2021-02-09 MED ORDER — IVERMECTIN 1 % EX CREA: 1.0000 "application " | TOPICAL_CREAM | Freq: Every day | CUTANEOUS | 11 refills | Status: DC

## 2021-02-09 NOTE — Addendum Note (Signed)
Addended by: Alfonso Patten on: 02/09/2021 03:48 PM   Modules accepted: Orders

## 2021-02-09 NOTE — Telephone Encounter (Signed)
Sent in soolantra cream daily and requested pharmacy to substitute with metronidazole 0.75% cream twice a day if soolantra is expensive for patient.  Please let patient know. Thank you!

## 2021-02-12 ENCOUNTER — Ambulatory Visit
Admission: RE | Admit: 2021-02-12 | Discharge: 2021-02-12 | Disposition: A | Discharge status: Home / Self Care | Payer: 59 | Source: Ambulatory Visit | Attending: Obstetrics and Gynecology | Admitting: Obstetrics and Gynecology

## 2021-02-12 ENCOUNTER — Other Ambulatory Visit: Payer: Self-pay | Admitting: Obstetrics and Gynecology

## 2021-02-12 ENCOUNTER — Other Ambulatory Visit: Payer: Self-pay

## 2021-02-12 DIAGNOSIS — R928 Other abnormal and inconclusive findings on diagnostic imaging of breast: Secondary | ICD-10-CM

## 2021-02-12 DIAGNOSIS — N6489 Other specified disorders of breast: Secondary | ICD-10-CM | POA: Diagnosis present

## 2021-02-12 DIAGNOSIS — N6310 Unspecified lump in the right breast, unspecified quadrant: Secondary | ICD-10-CM

## 2021-02-12 NOTE — Telephone Encounter (Signed)
LVM for patient advising Soolantra sent in, will substitute metronidazole if Soolantra too expensive.

## 2021-02-19 ENCOUNTER — Ambulatory Visit: Payer: Managed Care, Other (non HMO) | Admitting: Dermatology

## 2021-05-21 ENCOUNTER — Ambulatory Visit: Payer: 59 | Admitting: Dermatology

## 2021-05-23 ENCOUNTER — Ambulatory Visit: Payer: 59 | Admitting: Dermatology

## 2021-08-13 ENCOUNTER — Other Ambulatory Visit: Payer: Self-pay

## 2021-08-13 ENCOUNTER — Ambulatory Visit
Admission: RE | Admit: 2021-08-13 | Discharge: 2021-08-13 | Disposition: A | Discharge status: Home / Self Care | Payer: 59 | Source: Ambulatory Visit | Attending: Obstetrics and Gynecology | Admitting: Obstetrics and Gynecology

## 2021-08-13 DIAGNOSIS — N6310 Unspecified lump in the right breast, unspecified quadrant: Secondary | ICD-10-CM | POA: Diagnosis not present

## 2021-08-27 NOTE — Progress Notes (Signed)
? ?PCP: Rusty Aus, MD ? ? ?Chief Complaint  ?Patient presents with  ? Gynecologic Exam  ?  Has qs about birth control, interested in Paragard  ? ? ?HPI: ?     Ms. April Edwards is a 46 y.o. G1P1001 whose LMP was Patient's last menstrual period was 08/20/2021 (exact date)., presents today for her annual examination.  Her menses are regular every 28-30 days, lasting 4-6 days, mod to heavy flow for 2 days.  Dysmenorrhea none. She does not have intermenstrual bleeding. ?She was having some vasomotor sx and brain fog so started testosterone crm about 2 months ago with sx improvement. Feels  better.  ? ?Sex activity: single partner, contraception - withdrawal. Considering paragard IUD although hasn't had BC in years and never got pregnant. Had Mirena IUD in past and did well; doesn't want extra hormones now. She does not have vaginal dryness. ? ?Last Pap: 02/16/18  Results were: no abnormalities /neg HPV DNA.  ?No hx of abn paps with bx/tx.  ? ?Last mammogram: 02/12/21  Results were cat 3 RT breast cyst, had normal f/u 3/23; repeat bilat mammo due 9/23 ?There is no FH of breast cancer. There is no FH of ovarian cancer. The patient does do self-breast exams. ? ?Colonoscopy: never; wanted cologuard last yr but never received kit in mail; interested again this yr ? ?Tobacco use: The patient denies current or previous tobacco use. ?Alcohol use: none ?No drug use ?Exercise: moderately active ? ?She does get adequate calcium and Vitamin D in her diet. ? ?Labs with PCP. ? ? ?Past Medical History:  ?Diagnosis Date  ? Anxiety   ? Dysplastic nevus 04/25/2019  ? RUQ abdomen. Mild atypia  ? ? ?Past Surgical History:  ?Procedure Laterality Date  ? WISDOM TOOTH EXTRACTION    ? ? ?Family History  ?Problem Relation Age of Onset  ? Diabetes Mellitus II Paternal Grandmother   ? Brain cancer Paternal Grandfather   ? Lung cancer Mother   ? Kidney failure Father   ? Breast cancer Neg Hx   ? ? ?Social History  ? ?Socioeconomic History  ?  Marital status: Married  ?  Spouse name: Not on file  ? Number of children: Not on file  ? Years of education: Not on file  ? Highest education level: Not on file  ?Occupational History  ? Not on file  ?Tobacco Use  ? Smoking status: Never  ? Smokeless tobacco: Never  ?Vaping Use  ? Vaping Use: Never used  ?Substance and Sexual Activity  ? Alcohol use: Not Currently  ? Drug use: Never  ? Sexual activity: Yes  ?  Birth control/protection: None  ?Other Topics Concern  ? Not on file  ?Social History Narrative  ? Not on file  ? ?Social Determinants of Health  ? ?Financial Resource Strain: Not on file  ?Food Insecurity: Not on file  ?Transportation Needs: Not on file  ?Physical Activity: Not on file  ?Stress: Not on file  ?Social Connections: Not on file  ?Intimate Partner Violence: Not on file  ? ? ? ?Current Outpatient Medications:  ?  calcium carbonate (TUMS EX) 750 MG chewable tablet, Chew by mouth., Disp: , Rfl:  ?  mirtazapine (REMERON) 15 MG tablet, TAKE 1 TABLET BY MOUTH AT BEDTIME, Disp: , Rfl:  ?  Multiple Vitamin (MULTI-VITAMINS) TABS, Take by mouth., Disp: , Rfl:  ?  pantoprazole (PROTONIX) 40 MG tablet, Take 40 mg by mouth daily., Disp: , Rfl:  ?  RESTASIS 0.05 % ophthalmic emulsion, 1 drop 2 (two) times daily., Disp: , Rfl:  ? ? ? ? ?ROS: ? ?Review of Systems  ?Constitutional:  Negative for fatigue, fever and unexpected weight change.  ?Respiratory:  Negative for cough, shortness of breath and wheezing.   ?Cardiovascular:  Negative for chest pain, palpitations and leg swelling.  ?Gastrointestinal:  Negative for blood in stool, constipation, diarrhea, nausea and vomiting.  ?Endocrine: Negative for cold intolerance, heat intolerance and polyuria.  ?Genitourinary:  Negative for dyspareunia, dysuria, flank pain, frequency, genital sores, hematuria, menstrual problem, pelvic pain, urgency, vaginal bleeding, vaginal discharge and vaginal pain.  ?Musculoskeletal:  Negative for back pain, joint swelling and  myalgias.  ?Skin:  Negative for rash.  ?Neurological:  Negative for dizziness, syncope, light-headedness, numbness and headaches.  ?Hematological:  Negative for adenopathy.  ?Psychiatric/Behavioral:  Negative for agitation, confusion, sleep disturbance and suicidal ideas. The patient is not nervous/anxious.   ?BREAST: No symptoms ? ? ? ?Objective: ?BP 118/80   Ht 5' 2.5" (1.588 m)   Wt 143 lb (64.9 kg)   LMP 08/20/2021 (Exact Date)   BMI 25.74 kg/m?  ? ? ?Physical Exam ?Constitutional:   ?   Appearance: She is well-developed.  ?Genitourinary:  ?   Vulva normal.  ?   Right Labia: No rash, tenderness or lesions. ?   Left Labia: No tenderness, lesions or rash. ?   No vaginal discharge, erythema or tenderness.  ? ?   Right Adnexa: not tender and no mass present. ?   Left Adnexa: not tender and no mass present. ?   No cervical friability or polyp.  ?   Uterus is not enlarged or tender.  ?Breasts: ?   Right: No mass, nipple discharge, skin change or tenderness.  ?   Left: No mass, nipple discharge, skin change or tenderness.  ?Neck:  ?   Thyroid: No thyromegaly.  ?Cardiovascular:  ?   Rate and Rhythm: Normal rate and regular rhythm.  ?   Heart sounds: Normal heart sounds. No murmur heard. ?Pulmonary:  ?   Effort: Pulmonary effort is normal.  ?   Breath sounds: Normal breath sounds.  ?Abdominal:  ?   Palpations: Abdomen is soft.  ?   Tenderness: There is no abdominal tenderness. There is no guarding or rebound.  ?Musculoskeletal:     ?   General: Normal range of motion.  ?   Cervical back: Normal range of motion.  ?Lymphadenopathy:  ?   Cervical: No cervical adenopathy.  ?Neurological:  ?   General: No focal deficit present.  ?   Mental Status: She is alert and oriented to person, place, and time.  ?   Cranial Nerves: No cranial nerve deficit.  ?Skin: ?   General: Skin is warm and dry.  ?Psychiatric:     ?   Mood and Affect: Mood normal.     ?   Behavior: Behavior normal.     ?   Thought Content: Thought content  normal.     ?   Judgment: Judgment normal.  ?Vitals reviewed.  ? ? ?Assessment/Plan: ? ?Encounter for annual routine gynecological examination ? ?Encounter for other general counseling or advice on contraception--Paragard/Mirena pros/cons discussed. Pt to f/u with menses for insertion if desires one.  ? ?Encounter for screening mammogram for malignant neoplasm of breast - Plan: MM 3D SCREEN BREAST BILATERAL; pt to schedule mammo 9/23.  ? ?Screening for colon cancer - Plan: Cologuard; colonoscopy/cologuard discussed. Pt elects cologuard. Ref sent.  Will f/u with results. ? ?        ?GYN counsel breast self exam, mammography screening, menopause, adequate intake of calcium and vitamin D, diet and exercise ? ?  F/U ? Return in about 1 year (around 08/29/2022). ? ?Tian Mcmurtrey B. Filemon Breton, PA-C ?08/28/2021 ?9:17 AM ? ?

## 2021-08-28 ENCOUNTER — Encounter: Payer: Self-pay | Admitting: Obstetrics and Gynecology

## 2021-08-28 ENCOUNTER — Ambulatory Visit (INDEPENDENT_AMBULATORY_CARE_PROVIDER_SITE_OTHER): Payer: 59 | Admitting: Obstetrics and Gynecology

## 2021-08-28 VITALS — BP 118/80 | Ht 62.5 in | Wt 143.0 lb

## 2021-08-28 DIAGNOSIS — Z3009 Encounter for other general counseling and advice on contraception: Secondary | ICD-10-CM

## 2021-08-28 DIAGNOSIS — Z1231 Encounter for screening mammogram for malignant neoplasm of breast: Secondary | ICD-10-CM | POA: Diagnosis not present

## 2021-08-28 DIAGNOSIS — Z01419 Encounter for gynecological examination (general) (routine) without abnormal findings: Secondary | ICD-10-CM | POA: Diagnosis not present

## 2021-08-28 DIAGNOSIS — Z1211 Encounter for screening for malignant neoplasm of colon: Secondary | ICD-10-CM | POA: Diagnosis not present

## 2021-08-28 NOTE — Patient Instructions (Signed)
I value your feedback and you entrusting [place] with your care. If you get a [facility] patient survey, I would appreciate you taking the time to let us know about your experience today. Thank you! ? ? ?

## 2021-09-14 LAB — COLOGUARD: COLOGUARD: NEGATIVE

## 2022-02-03 ENCOUNTER — Ambulatory Visit
Admission: RE | Admit: 2022-02-03 | Discharge: 2022-02-03 | Disposition: A | Discharge status: Home / Self Care | Payer: 59 | Source: Ambulatory Visit | Attending: Obstetrics and Gynecology | Admitting: Obstetrics and Gynecology

## 2022-02-03 DIAGNOSIS — Z1231 Encounter for screening mammogram for malignant neoplasm of breast: Secondary | ICD-10-CM | POA: Diagnosis present

## 2023-01-02 ENCOUNTER — Other Ambulatory Visit: Payer: Self-pay | Admitting: Obstetrics and Gynecology

## 2023-01-02 DIAGNOSIS — Z1231 Encounter for screening mammogram for malignant neoplasm of breast: Secondary | ICD-10-CM

## 2023-01-08 ENCOUNTER — Ambulatory Visit: Payer: 59 | Admitting: Dermatology

## 2023-01-08 DIAGNOSIS — L720 Epidermal cyst: Secondary | ICD-10-CM | POA: Diagnosis not present

## 2023-01-08 DIAGNOSIS — Z1283 Encounter for screening for malignant neoplasm of skin: Secondary | ICD-10-CM

## 2023-01-08 DIAGNOSIS — L578 Other skin changes due to chronic exposure to nonionizing radiation: Secondary | ICD-10-CM

## 2023-01-08 DIAGNOSIS — L814 Other melanin hyperpigmentation: Secondary | ICD-10-CM | POA: Diagnosis not present

## 2023-01-08 DIAGNOSIS — Z86018 Personal history of other benign neoplasm: Secondary | ICD-10-CM

## 2023-01-08 DIAGNOSIS — L729 Follicular cyst of the skin and subcutaneous tissue, unspecified: Secondary | ICD-10-CM

## 2023-01-08 DIAGNOSIS — D492 Neoplasm of unspecified behavior of bone, soft tissue, and skin: Secondary | ICD-10-CM

## 2023-01-08 DIAGNOSIS — D2271 Melanocytic nevi of right lower limb, including hip: Secondary | ICD-10-CM | POA: Diagnosis not present

## 2023-01-08 NOTE — Patient Instructions (Addendum)

## 2023-01-08 NOTE — Progress Notes (Signed)
Follow-Up Visit   Subjective  April Edwards is a 47 y.o. female who presents for the following: Skin Cancer Screening and Full Body Skin Exam  The patient presents for Total-Body Skin Exam (TBSE) for skin cancer screening and mole check. The patient has spots, moles and lesions to be evaluated, some may be new or changing and the patient may have concern these could be cancer.    The following portions of the chart were reviewed this encounter and updated as appropriate: medications, allergies, medical history  Review of Systems:  No other skin or systemic complaints except as noted in HPI or Assessment and Plan.  Objective  Well appearing patient in no apparent distress; mood and affect are within normal limits.  A full examination was performed including scalp, head, eyes, ears, nose, lips, neck, chest, axillae, abdomen, back, buttocks, bilateral upper extremities, bilateral lower extremities, hands, feet, fingers, toes, fingernails, and toenails. All findings within normal limits unless otherwise noted below.   Relevant physical exam findings are noted in the Assessment and Plan.  right prox medial calf 0.7 cm medium brown macule          Assessment & Plan   SKIN CANCER SCREENING PERFORMED TODAY.  ACTINIC DAMAGE - Chronic condition, secondary to cumulative UV/sun exposure - diffuse scaly erythematous macules with underlying dyspigmentation - Recommend daily broad spectrum sunscreen SPF 30+ to sun-exposed areas, reapply every 2 hours as needed.  - Staying in the shade or wearing long sleeves, sun glasses (UVA+UVB protection) and wide brim hats (4-inch brim around the entire circumference of the hat) are also recommended for sun protection.  - Call for new or changing lesions.  LENTIGINES, SEBORRHEIC KERATOSES, HEMANGIOMAS - Benign normal skin lesions - Benign-appearing - Call for any changes  MELANOCYTIC NEVI Left crown scalp and face  - flesh macules and  papules - Benign appearing on exam today - Observation - Call clinic for new or changing moles - Recommend daily use of broad spectrum spf 30+ sunscreen to sun-exposed areas.   EPIDERMAL INCLUSION CYST Exam: Subcutaneous nodule at left posterior axillary area.  Benign-appearing. Exam most consistent with an epidermal inclusion cyst. Discussed that a cyst is a benign growth that can grow over time and sometimes get irritated or inflamed. Recommend observation if it is not bothersome. Discussed option of surgical excision to remove it if it is growing, symptomatic, or other changes noted. Please call for new or changing lesions so they can be evaluated.    HISTORY OF DYSPLASTIC NEVUS No evidence of recurrence today Recommend regular full body skin exams Recommend daily broad spectrum sunscreen SPF 30+ to sun-exposed areas, reapply every 2 hours as needed.  Call if any new or changing lesions are noted between office visits   Neoplasm of skin right prox medial calf  Epidermal / dermal shaving  Lesion diameter (cm):  0.7 Informed consent: discussed and consent obtained   Timeout: patient name, date of birth, surgical site, and procedure verified   Procedure prep:  Patient was prepped and draped in usual sterile fashion Prep type:  Isopropyl alcohol Anesthesia: the lesion was anesthetized in a standard fashion   Anesthetic:  1% lidocaine w/ epinephrine 1-100,000 buffered w/ 8.4% NaHCO3 Hemostasis achieved with: pressure, aluminum chloride and electrodesiccation   Outcome: patient tolerated procedure well   Post-procedure details: sterile dressing applied and wound care instructions given   Dressing type: bandage and petrolatum    Related Procedures Anatomic Pathology Report   Return in  about 1 year (around 01/08/2024) for TBSE, hx of Dysplastic nevus .  IAngelique Holm, CMA, am acting as scribe for Armida Sans, MD .   Documentation: I have reviewed the above documentation  for accuracy and completeness, and I agree with the above.  Armida Sans, MD

## 2023-01-16 ENCOUNTER — Encounter: Payer: Self-pay | Admitting: Dermatology

## 2023-01-17 LAB — ANATOMIC PATHOLOGY REPORT

## 2023-01-17 LAB — SPECIMEN STATUS REPORT

## 2023-01-21 ENCOUNTER — Telehealth: Payer: Self-pay

## 2023-01-21 NOTE — Telephone Encounter (Signed)
Patient informed of pathology results 

## 2023-01-21 NOTE — Telephone Encounter (Signed)
-----   Message from Armida Sans sent at 01/21/2023 12:26 PM EDT ----- Diagnosis synopsis: Comment Comment: Part A-right proximal medial calf,Skin Biopsy: COMPOUND DYSPLASTIC NEVUS WITH MILD ATYPIA, MARGINS INVOLVED, SEE COMMENT    Mild dysplastic Recheck next visit

## 2023-02-05 ENCOUNTER — Other Ambulatory Visit: Payer: Self-pay | Admitting: Obstetrics and Gynecology

## 2023-02-05 ENCOUNTER — Ambulatory Visit
Admission: RE | Admit: 2023-02-05 | Discharge: 2023-02-05 | Disposition: A | Discharge status: Home / Self Care | Payer: 59 | Source: Ambulatory Visit | Attending: Obstetrics and Gynecology | Admitting: Obstetrics and Gynecology

## 2023-02-05 DIAGNOSIS — Z1231 Encounter for screening mammogram for malignant neoplasm of breast: Secondary | ICD-10-CM | POA: Insufficient documentation

## 2023-06-09 ENCOUNTER — Ambulatory Visit: Payer: 59 | Admitting: Dermatology

## 2023-06-10 ENCOUNTER — Ambulatory Visit: Payer: 59 | Admitting: Dermatology

## 2023-06-25 IMAGING — MG MM DIGITAL DIAGNOSTIC UNILAT*R* W/ TOMO W/ CAD
4 series · 4 of 12 positions shown · non-contrast
Comparison: Previous exam(s).

CLINICAL DATA: BI-RADS 3 follow-up of a RIGHT breast mass,
initiated January 2021

EXAM:
DIGITAL DIAGNOSTIC UNILATERAL RIGHT MAMMOGRAM WITH TOMOSYNTHESIS AND
CAD; ULTRASOUND RIGHT BREAST LIMITED
TECHNIQUE: Right digital diagnostic mammography and breast tomosynthesis was
performed. The images were evaluated with computer-aided detection.;
Targeted ultrasound examination of the right breast was performed

[R CC synth-2D]
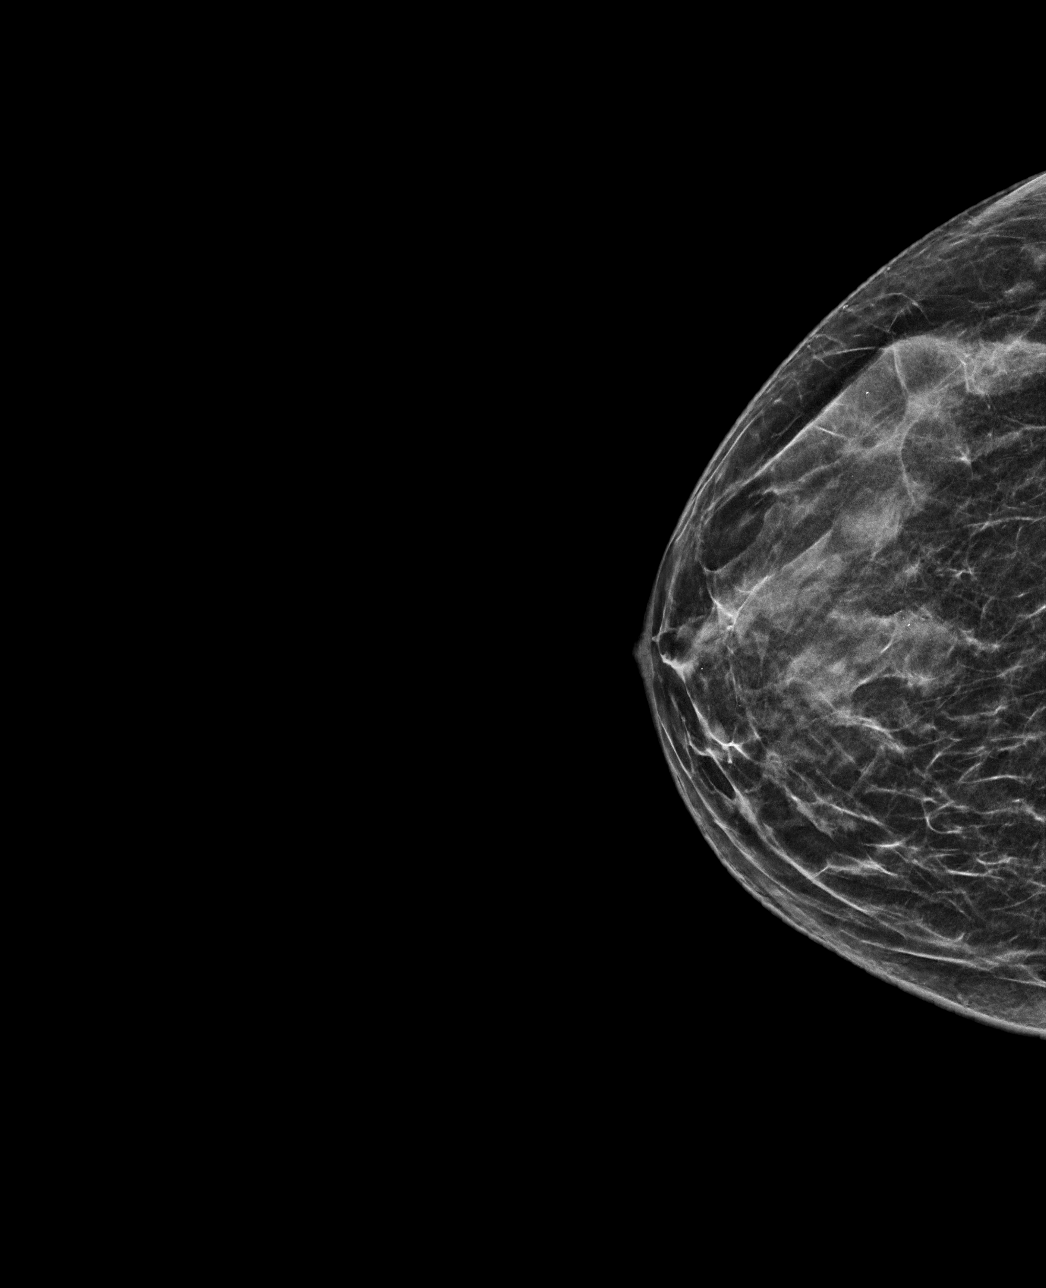

[R MLO synth-2D]
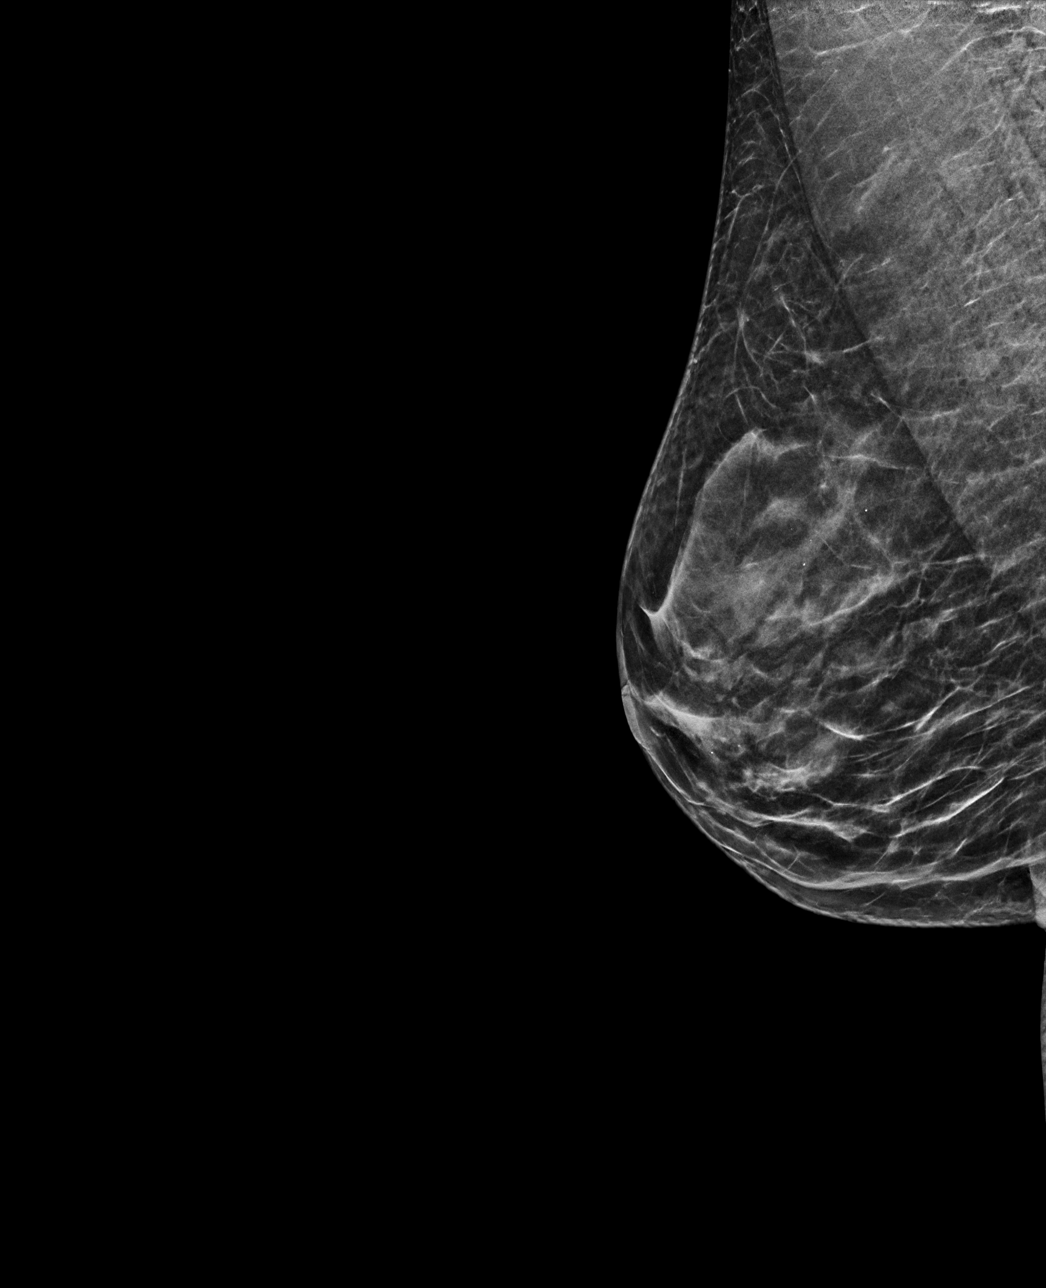

[R MLO tomo · tomo slice 23/46.0]
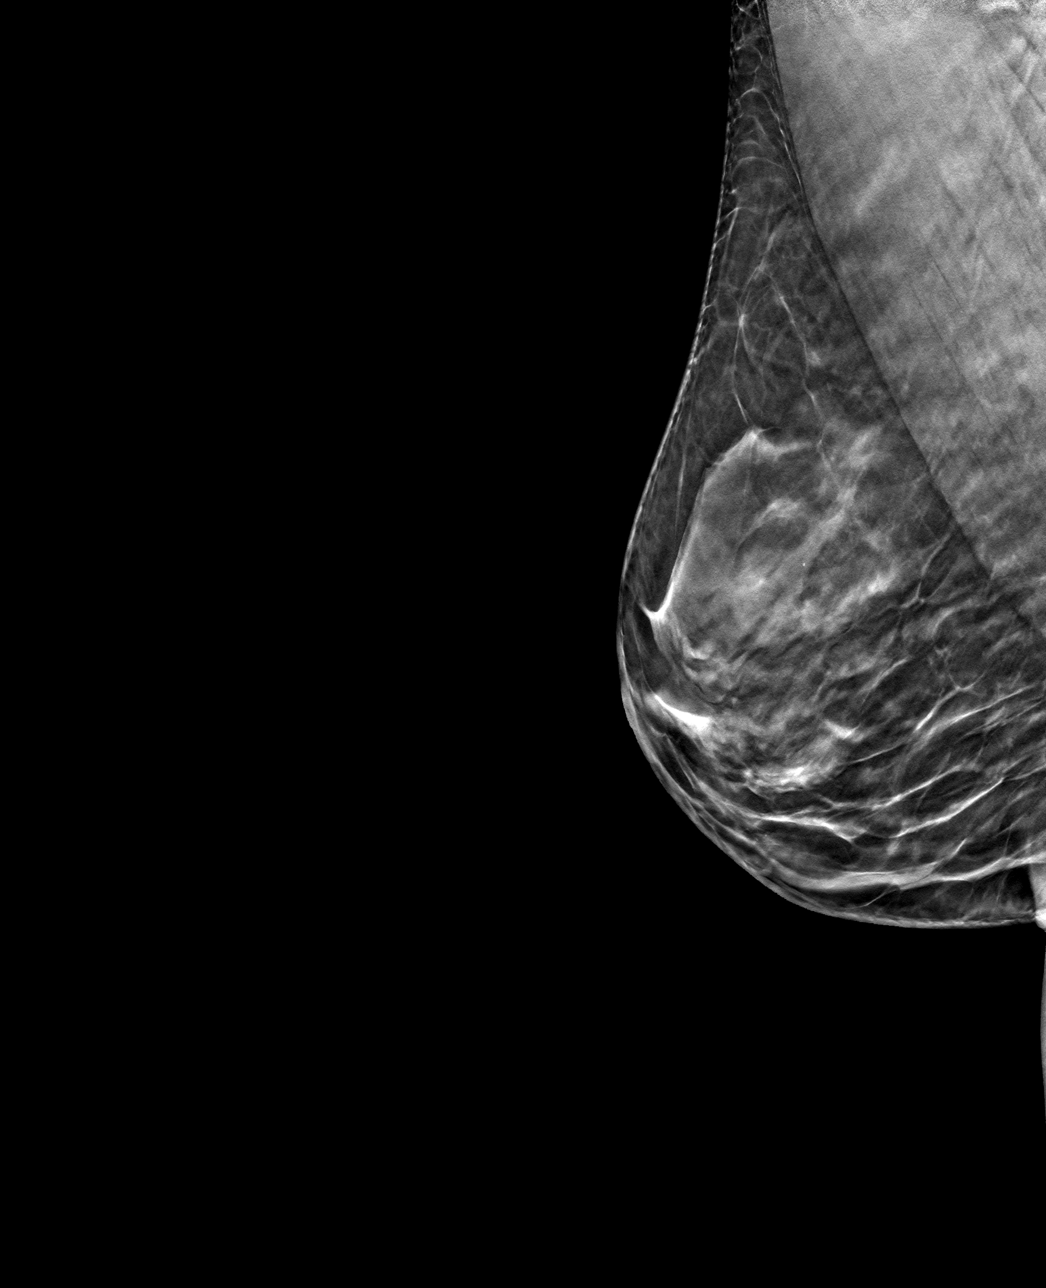

[R CC tomo · tomo slice 23/46.0]
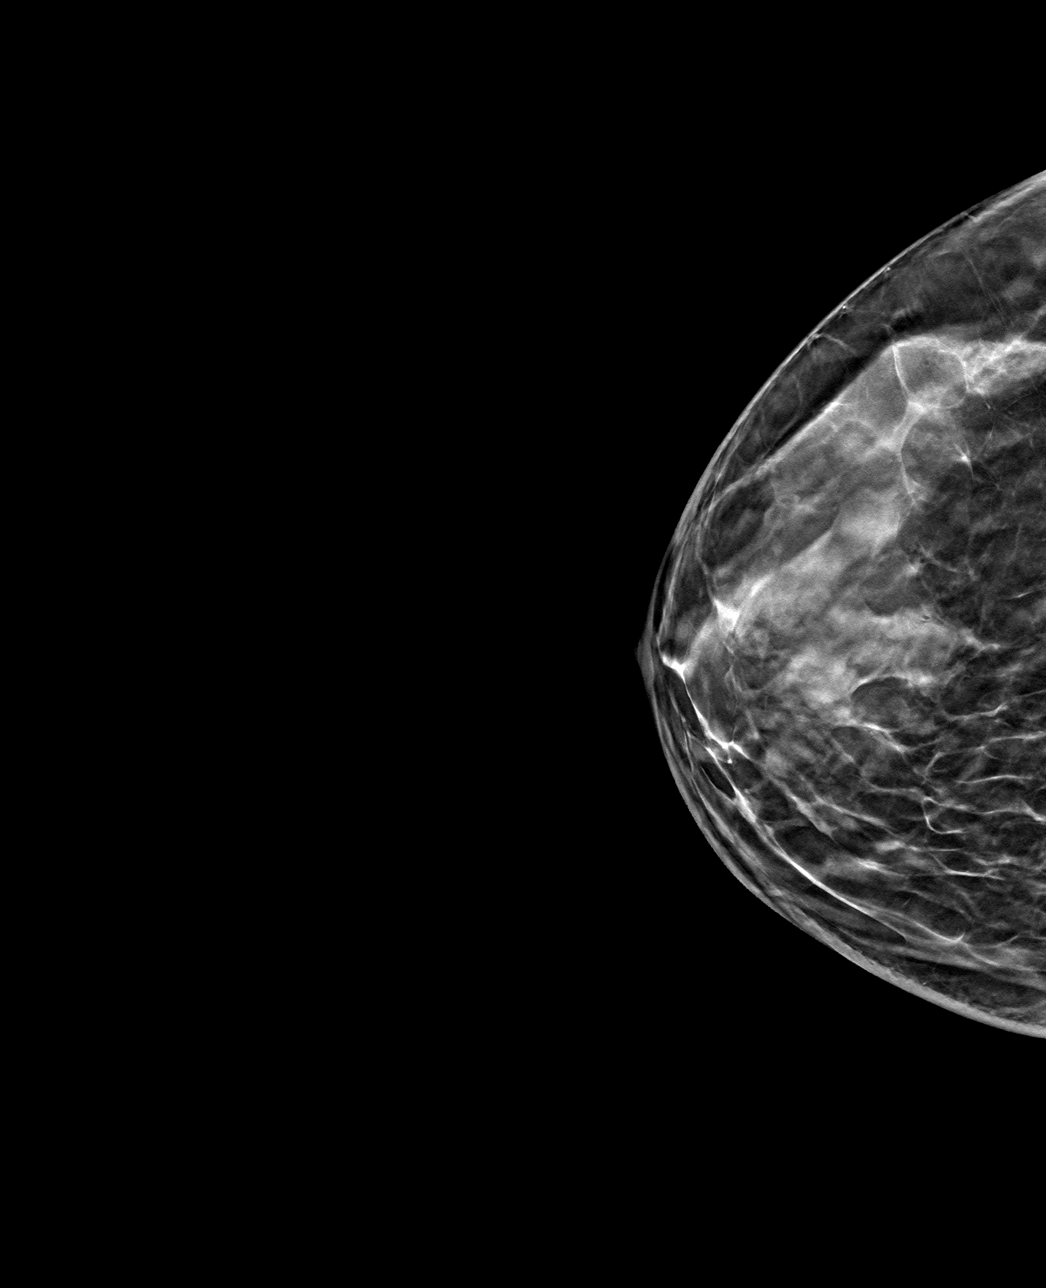

[4 of 12 positions shown; findings below may reference images not displayed]

ACR Breast Density Category c: The breast tissue is heterogeneously
dense, which may obscure small masses.
FINDINGS: Diagnostic images of the RIGHT breast demonstrate similar to mildly
decreased size of a 5-6 mm oval circumscribed mass in the RIGHT
outer breast at far posterior depth seen on MLO view slice 33.

On physical exam, no suspicious mass is appreciated.

Targeted ultrasound was performed of the RIGHT outer breast. At 9
o'clock 6 cm from the nipple, there is an oval circumscribed
anechoic mass with posterior acoustic enhancement. It measures 8 x 3
x 6 mm and is consistent with a benign simple cyst. At [DATE] 8-9 cm
from the nipple, there is an oval circumscribed anechoic mass with
posterior acoustic enhancement. It measures 5 x 3 x 3 mm and is
consistent with a benign simple cyst. This is favored to correspond
to the site of screening mammographic concern. Multiple benign cysts
are incidentally noted during real-time examination.
IMPRESSION: There is a benign cyst at the site of original screening
mammographic concern. Multiple benign cysts are noted in the RIGHT
breast. As such, recommend return to annual screening mammography.

RECOMMENDATION:
Recommend return to annual screening mammography, due January 2022

I have discussed the findings and recommendations with the patient.
If applicable, a reminder letter will be sent to the patient
regarding the next appointment.

BI-RADS CATEGORY  2: Benign.

## 2023-07-22 ENCOUNTER — Other Ambulatory Visit: Payer: Self-pay | Admitting: Internal Medicine

## 2023-07-22 DIAGNOSIS — E782 Mixed hyperlipidemia: Secondary | ICD-10-CM

## 2023-07-22 DIAGNOSIS — Z0000 Encounter for general adult medical examination without abnormal findings: Secondary | ICD-10-CM

## 2023-07-31 ENCOUNTER — Ambulatory Visit
Admission: RE | Admit: 2023-07-31 | Discharge: 2023-07-31 | Disposition: A | Discharge status: Home / Self Care | Payer: Self-pay | Source: Ambulatory Visit | Attending: Internal Medicine | Admitting: Internal Medicine

## 2023-07-31 DIAGNOSIS — E782 Mixed hyperlipidemia: Secondary | ICD-10-CM | POA: Insufficient documentation

## 2023-07-31 DIAGNOSIS — Z0000 Encounter for general adult medical examination without abnormal findings: Secondary | ICD-10-CM | POA: Insufficient documentation

## 2023-11-03 ENCOUNTER — Encounter: Payer: Self-pay | Admitting: Dermatology

## 2023-11-03 ENCOUNTER — Ambulatory Visit: Payer: Self-pay | Admitting: Dermatology

## 2023-11-03 DIAGNOSIS — L729 Follicular cyst of the skin and subcutaneous tissue, unspecified: Secondary | ICD-10-CM

## 2023-11-03 DIAGNOSIS — L821 Other seborrheic keratosis: Secondary | ICD-10-CM

## 2023-11-03 DIAGNOSIS — L820 Inflamed seborrheic keratosis: Secondary | ICD-10-CM

## 2023-11-03 DIAGNOSIS — Z7189 Other specified counseling: Secondary | ICD-10-CM

## 2023-11-03 DIAGNOSIS — L720 Epidermal cyst: Secondary | ICD-10-CM | POA: Diagnosis not present

## 2023-11-03 DIAGNOSIS — Z79899 Other long term (current) drug therapy: Secondary | ICD-10-CM

## 2023-11-03 MED ORDER — DOXYCYCLINE MONOHYDRATE 100 MG PO CAPS: 100.0000 mg | ORAL_CAPSULE | Freq: Two times a day (BID) | ORAL | 1 refills | Status: AC

## 2023-11-03 NOTE — Progress Notes (Signed)
   Follow-Up Visit   Subjective  April Edwards is a 48 y.o. female who presents for the following: cyst L post axillary has gotten larger over past 2 weeks, hot compresses, check scaly spot lower back, just noticed ~6wks ago The patient has spots, moles and lesions to be evaluated, some may be new or changing and the patient may have concern these could be cancer.  The following portions of the chart were reviewed this encounter and updated as appropriate: medications, allergies, medical history  Review of Systems:  No other skin or systemic complaints except as noted in HPI or Assessment and Plan.  Objective  Well appearing patient in no apparent distress; mood and affect are within normal limits.  A focused examination was performed of the following areas: L axilla, back  Relevant exam findings are noted in the Assessment and Plan.  Left Lower Back x 1 Stuck on waxy paps with erythema  Assessment & Plan   INFLAMED EPIDERMAL INCLUSION CYST - inflamed (not abscessed) Exam: Subcutaneous erythematous nodule at L post axillary fold 1.5cm  Benign-appearing. Exam most consistent with an epidermal inclusion cyst. Discussed that a cyst is a benign growth that can grow over time and sometimes get irritated or inflamed. Recommend observation if it is not bothersome. Discussed option of surgical excision to remove it if it is growing, symptomatic, or other changes noted. Please call for new or changing lesions so they can be evaluated.  Start Doxycycline  100mg  1 po bid x 5 days then decrease to 1 po qd, with food and drink Recommend scheduling surgery  Doxycycline  should be taken with food to prevent nausea. Do not lay down for 30 minutes after taking. Be cautious with sun exposure and use good sun protection while on this medication. Pregnant women should not take this medication.    SEBORRHEIC KERATOSIS back - Stuck-on, waxy, tan-brown papules and/or plaques  - Benign-appearing -  Discussed benign etiology and prognosis. - Observe - Call for any changes  INFLAMED SEBORRHEIC KERATOSIS Left Lower Back x 1 Symptomatic, irritating, patient would like treated. Destruction of lesion - Left Lower Back x 1 Complexity: simple   Destruction method: cryotherapy   Informed consent: discussed and consent obtained   Timeout:  patient name, date of birth, surgical site, and procedure verified Lesion destroyed using liquid nitrogen: Yes   Region frozen until ice ball extended beyond lesion: Yes   Outcome: patient tolerated procedure well with no complications   Post-procedure details: wound care instructions given    Return for to be scheduled for surgery cyst L post axillary fold with Dr. Annette Barters or Dr. Felipe Horton.  I, Rollie Clipper, RMA, am acting as scribe for Celine Collard, MD .   Documentation: I have reviewed the above documentation for accuracy and completeness, and I agree with the above.  Celine Collard, MD

## 2023-11-03 NOTE — Patient Instructions (Addendum)
 Start Doxycycline  100mg  1 pill twice a day for 5 days then decrease to 1 po daily, take with food and drink    Pre-Operative Instructions  You are scheduled for a surgical procedure at [facility]. We recommend you read the following instructions. If you have any questions or concerns, please call the office at [phone].  Shower and wash the entire body with soap and water the day of your surgery paying special attention to cleansing at and around the planned surgery site.  Avoid aspirin or aspirin containing products at least fourteen (14) days prior to your surgical procedure and for at least one week (7 Days) after your surgical procedure. If you take aspirin on a regular basis for heart disease or history of stroke or for any other reason, we may recommend you continue taking aspirin but please notify us  if you take this on a regular basis. Aspirin can cause more bleeding to occur during surgery as well as prolonged bleeding and bruising after surgery.   Avoid other nonsteroidal pain medications at least one week prior to surgery and at least one week prior to your surgery. These include medications such as Ibuprofen (Motrin, Advil and Nuprin), Naprosyn, Voltaren, Relafen, etc. If medications are used for therapeutic reasons, please inform us  as they can cause increased bleeding or prolonged bleeding during and bruising after surgical procedures.   Please advise us  if you are taking any blood thinner medications such as Coumadin or Dipyridamole or Plavix or similar medications. These cause increased bleeding and prolonged bleeding during procedures and bruising after surgical procedures. We may have to consider discontinuing these medications briefly prior to and shortly after your surgery if safe to do so.   Please inform us  of all medications you are currently taking. All medications that are taken regularly should be taken the day of surgery as you always do. Nevertheless, we  need to be informed of what medications you are taking prior to surgery to know whether they will affect the procedure or cause any complications.   Please inform us  of any medication allergies. Also inform us  of whether you have allergies to Latex or rubber products or whether you have had any adverse reaction to Lidocaine or Epinephrine.  Please inform us  of any prosthetic or artificial body parts such as artificial heart valve, joint replacements, etc., or similar condition that might require preoperative antibiotics.   We recommend avoidance of alcohol at least two weeks prior to surgery and continued avoidance for at least two weeks after surgery.   We recommend discontinuation of tobacco smoking at least two weeks prior to surgery and continued abstinence for at least two weeks after surgery.  Do not plan strenuous exercise, strenuous work or strenuous lifting for approximately four weeks after your surgery.   We request if you are unable to make your scheduled surgical appointment, please call us  at least a week in advance or as soon as you are aware of a problem so that we can cancel or reschedule the appointment.   You MAY TAKE TYLENOL (acetaminophen) for pain as it is not a blood thinner.   PLEASE PLAN TO BE IN TOWN FOR TWO WEEKS FOLLOWING SURGERY, THIS IS IMPORTANT SO YOU CAN BE CHECKED FOR DRESSING CHANGES, SUTURE REMOVAL AND TO MONITOR FOR POSSIBLE COMPLICATIONS.    Cryotherapy Aftercare  Wash gently with soap and water everyday.   Apply Vaseline and Band-Aid daily until healed.   Due to recent changes in healthcare laws, you  may see results of your pathology and/or laboratory studies on MyChart before the doctors have had a chance to review them. We understand that in some cases there may be results that are confusing or concerning to you. Please understand that not all results are received at the same time and often the doctors may need to interpret multiple results in order to  provide you with the best plan of care or course of treatment. Therefore, we ask that you please give us  2 business days to thoroughly review all your results before contacting the office for clarification. Should we see a critical lab result, you will be contacted sooner.   If You Need Anything After Your Visit  If you have any questions or concerns for your doctor, please call our main line at [phone] and press option 4 to reach your doctor's medical assistant. If no one answers, please leave a voicemail as directed and we will return your call as soon as possible. Messages left after 4 pm will be answered the following business day.   You may also send us  a message via MyChart. We typically respond to MyChart messages within 1-2 business days.  For prescription refills, please ask your pharmacy to contact our office. Our fax number is [phone].  If you have an urgent issue when the clinic is closed that cannot wait until the next business day, you can page your doctor at the number below.    Please note that while we do our best to be available for urgent issues outside of office hours, we are not available 24/7.   If you have an urgent issue and are unable to reach us , you may choose to seek medical care at your doctor's office, retail clinic, urgent care center, or emergency room.  If you have a medical emergency, please immediately call 911 or go to the emergency department.  Pager Numbers  - Dr. Bary Likes: [phone]  - Dr. Annette Barters: [phone]  - Dr. Felipe Horton: [phone]   In the event of inclement weather, please call our main line at [phone] for an update on the status of any delays or closures.  Dermatology Medication Tips: Please keep the boxes that topical medications come in in order to help keep track of the instructions about where and how to use these. Pharmacies typically print the medication instructions only on the boxes and not directly on the  medication tubes.   If your medication is too expensive, please contact our office at [phone] option 4 or send us  a message through MyChart.   We are unable to tell what your co-pay for medications will be in advance as this is different depending on your insurance coverage. However, we may be able to find a substitute medication at lower cost or fill out paperwork to get insurance to cover a needed medication.   If a prior authorization is required to get your medication covered by your insurance company, please allow us  1-2 business days to complete this process.  Drug prices often vary depending on where the prescription is filled and some pharmacies may offer cheaper prices.  The website www.goodrx.com contains coupons for medications through different pharmacies. The prices here do not account for what the cost may be with help from insurance (it may be cheaper with your insurance), but the website can give you the price if you did not use any insurance.  - You can print the associated coupon and take it with your prescription to the pharmacy.  -  You may also stop by our office during regular business hours and pick up a GoodRx coupon card.  - If you need your prescription sent electronically to a different pharmacy, notify our office through [facility] or by phone at [phone] option 4.     Si Usted Necesita Algo Despus de Su Visita  Tambin puede enviarnos un mensaje a travs de Clinical cytogeneticist. Por lo general respondemos a los mensajes de MyChart en el transcurso de 1 a 2 das hbiles.  Para renovar recetas, por favor pida a su farmacia que se ponga en contacto con nuestra oficina. Franz Jacks de fax es [place] [phone].  Si tiene un asunto urgente cuando la clnica est cerrada y que no puede esperar hasta el siguiente da hbil, puede llamar/localizar a su doctor(a) al nmero que aparece a continuacin.   Por favor, tenga en cuenta que aunque hacemos todo lo posible  para estar disponibles para asuntos urgentes fuera del horario de [place], no estamos disponibles las 24 horas del da, los 7 [address]  [address] de la [place].   Si tiene un problema urgente y no puede comunicarse con nosotros, puede optar por buscar atencin mdica  en el consultorio de su doctor(a), en una clnica privada, en un centro de atencin urgente o en una sala de emergencias.  Si tiene Engineer, drilling, por favor llame inmediatamente al 911 o vaya a la sala de emergencias.  Nmeros de bper  - Dr. Bary Likes: [phone]  - Dra. Annette Barters: [phone]  - Dr. Felipe Horton: [phone]   En caso de inclemencias del tiempo, por favor llame a Lajuan Pila principal al [phone] para una actualizacin sobre el [place] de cualquier retraso o cierre.  Consejos para la medicacin en dermatologa: Por favor, guarde las cajas en las que vienen los medicamentos de uso tpico para ayudarle a seguir las instrucciones sobre dnde y cmo usarlos. Las farmacias generalmente imprimen las instrucciones del medicamento slo en las cajas y no directamente en los tubos del [place].   Si su medicamento es muy caro, por favor, pngase en contacto con Bettyjane Brunet llamando al [phone] y presione la opcin 4 o envenos un mensaje a travs de Clinical cytogeneticist.   No podemos decirle cul ser su copago por los medicamentos por adelantado ya que esto es diferente dependiendo de la cobertura de su seguro. Sin embargo, es posible que podamos encontrar un medicamento sustituto a Audiological scientist un formulario para que el seguro cubra el medicamento que se considera necesario.   Si se requiere una autorizacin previa para que su compaa de seguros [place] su medicamento, por favor permtanos de 1 a 2 das hbiles para completar este proceso.  Los precios de los medicamentos varan con frecuencia dependiendo del Environmental consultant de dnde se surte la receta y alguna farmacias pueden ofrecer precios ms baratos.  El sitio web  www.goodrx.com tiene cupones para medicamentos de Health and safety inspector. Los precios aqu no tienen en cuenta lo que podra costar con la ayuda del seguro (puede ser ms barato con su seguro), pero el sitio web puede darle el precio si no utiliz Tourist information centre manager.  - Puede imprimir el cupn correspondiente y llevarlo con su receta a la farmacia.  - Tambin puede pasar por nuestra oficina durante el horario de atencin regular y Education officer, museum una tarjeta de cupones de GoodRx.  - Si necesita que su receta se enve electrnicamente a Psychiatrist, informe a nuestra oficina a travs de MyChart de [place] o por telfono llamando al [phone]  y presione la opcin 4.

## 2023-12-30 ENCOUNTER — Encounter: Admitting: Dermatology

## 2024-01-27 ENCOUNTER — Ambulatory Visit: Payer: 59 | Admitting: Dermatology

## 2024-02-01 ENCOUNTER — Ambulatory Visit: Admitting: Dermatology

## 2024-02-03 ENCOUNTER — Ambulatory Visit: Admitting: Dermatology

## 2024-03-22 ENCOUNTER — Other Ambulatory Visit: Payer: Self-pay | Admitting: Obstetrics and Gynecology

## 2024-03-22 DIAGNOSIS — Z1231 Encounter for screening mammogram for malignant neoplasm of breast: Secondary | ICD-10-CM

## 2024-03-30 ENCOUNTER — Ambulatory Visit: Admitting: Dermatology

## 2024-03-31 ENCOUNTER — Other Ambulatory Visit: Payer: Self-pay

## 2024-03-31 ENCOUNTER — Ambulatory Visit
Admission: RE | Admit: 2024-03-31 | Discharge: 2024-03-31 | Disposition: A | Discharge status: Home / Self Care | Source: Ambulatory Visit | Attending: Obstetrics and Gynecology | Admitting: Obstetrics and Gynecology

## 2024-03-31 DIAGNOSIS — Z1231 Encounter for screening mammogram for malignant neoplasm of breast: Secondary | ICD-10-CM | POA: Insufficient documentation

## 2024-03-31 MED ORDER — FLUZONE 0.5 ML IM SUSY
0.5000 mL | PREFILLED_SYRINGE | Freq: Once | INTRAMUSCULAR | 0 refills | Status: AC
  Filled 2024-03-31: qty 0.5, 1d supply, fill #0

## 2024-06-01 ENCOUNTER — Ambulatory Visit: Admitting: Dermatology

## 2024-06-28 ENCOUNTER — Ambulatory Visit: Admitting: Dermatology
